# Patient Record
Sex: Male | Born: 1955 | Race: White | Hispanic: No | Marital: Single | Smoking: Never smoker
Health system: Southern US, Community
[De-identification: ages and names within clinical notes are randomized; demographics above are authoritative.]

## PROBLEM LIST (undated history)

## (undated) DIAGNOSIS — K219 Gastro-esophageal reflux disease without esophagitis: Secondary | ICD-10-CM

## (undated) DIAGNOSIS — IMO0002 Reserved for concepts with insufficient information to code with codable children: Secondary | ICD-10-CM

## (undated) DIAGNOSIS — S42402A Unspecified fracture of lower end of left humerus, initial encounter for closed fracture: Secondary | ICD-10-CM

## (undated) DIAGNOSIS — R3911 Hesitancy of micturition: Secondary | ICD-10-CM

## (undated) DIAGNOSIS — D649 Anemia, unspecified: Secondary | ICD-10-CM

## (undated) DIAGNOSIS — R42 Dizziness and giddiness: Secondary | ICD-10-CM

## (undated) DIAGNOSIS — K8020 Calculus of gallbladder without cholecystitis without obstruction: Secondary | ICD-10-CM

## (undated) DIAGNOSIS — Z87448 Personal history of other diseases of urinary system: Secondary | ICD-10-CM

## (undated) DIAGNOSIS — N400 Enlarged prostate without lower urinary tract symptoms: Secondary | ICD-10-CM

## (undated) DIAGNOSIS — K5792 Diverticulitis of intestine, part unspecified, without perforation or abscess without bleeding: Secondary | ICD-10-CM

## (undated) DIAGNOSIS — Z87898 Personal history of other specified conditions: Secondary | ICD-10-CM

## (undated) DIAGNOSIS — R809 Proteinuria, unspecified: Secondary | ICD-10-CM

## (undated) HISTORY — PX: OTHER SURGICAL HISTORY: SHX169

## (undated) HISTORY — PX: WISDOM TOOTH EXTRACTION: SHX21

---

## 2014-11-17 ENCOUNTER — Emergency Department (HOSPITAL_BASED_OUTPATIENT_CLINIC_OR_DEPARTMENT_OTHER): Payer: BLUE CROSS/BLUE SHIELD

## 2014-11-17 ENCOUNTER — Inpatient Hospital Stay (HOSPITAL_BASED_OUTPATIENT_CLINIC_OR_DEPARTMENT_OTHER)
Admission: EM | Admit: 2014-11-17 | Discharge: 2014-11-30 | DRG: 392 | Disposition: A | Discharge status: Home Health | Payer: BLUE CROSS/BLUE SHIELD | Attending: General Surgery | Admitting: General Surgery

## 2014-11-17 ENCOUNTER — Encounter (HOSPITAL_BASED_OUTPATIENT_CLINIC_OR_DEPARTMENT_OTHER): Payer: Self-pay | Admitting: Emergency Medicine

## 2014-11-17 DIAGNOSIS — R350 Frequency of micturition: Secondary | ICD-10-CM | POA: Diagnosis present

## 2014-11-17 DIAGNOSIS — R809 Proteinuria, unspecified: Secondary | ICD-10-CM | POA: Diagnosis present

## 2014-11-17 DIAGNOSIS — Z79899 Other long term (current) drug therapy: Secondary | ICD-10-CM

## 2014-11-17 DIAGNOSIS — R319 Hematuria, unspecified: Secondary | ICD-10-CM | POA: Diagnosis present

## 2014-11-17 DIAGNOSIS — K819 Cholecystitis, unspecified: Secondary | ICD-10-CM

## 2014-11-17 DIAGNOSIS — R109 Unspecified abdominal pain: Secondary | ICD-10-CM | POA: Diagnosis not present

## 2014-11-17 DIAGNOSIS — N400 Enlarged prostate without lower urinary tract symptoms: Secondary | ICD-10-CM | POA: Diagnosis present

## 2014-11-17 DIAGNOSIS — K5720 Diverticulitis of large intestine with perforation and abscess without bleeding: Secondary | ICD-10-CM | POA: Diagnosis not present

## 2014-11-17 DIAGNOSIS — N401 Enlarged prostate with lower urinary tract symptoms: Secondary | ICD-10-CM | POA: Diagnosis present

## 2014-11-17 DIAGNOSIS — K8020 Calculus of gallbladder without cholecystitis without obstruction: Secondary | ICD-10-CM | POA: Diagnosis present

## 2014-11-17 DIAGNOSIS — K219 Gastro-esophageal reflux disease without esophagitis: Secondary | ICD-10-CM | POA: Diagnosis present

## 2014-11-17 DIAGNOSIS — Z1630 Resistance to unspecified antimicrobial drugs: Secondary | ICD-10-CM | POA: Diagnosis present

## 2014-11-17 DIAGNOSIS — Z87891 Personal history of nicotine dependence: Secondary | ICD-10-CM

## 2014-11-17 DIAGNOSIS — K5732 Diverticulitis of large intestine without perforation or abscess without bleeding: Secondary | ICD-10-CM

## 2014-11-17 DIAGNOSIS — K56609 Unspecified intestinal obstruction, unspecified as to partial versus complete obstruction: Secondary | ICD-10-CM

## 2014-11-17 DIAGNOSIS — IMO0002 Reserved for concepts with insufficient information to code with codable children: Secondary | ICD-10-CM | POA: Diagnosis not present

## 2014-11-17 DIAGNOSIS — K5792 Diverticulitis of intestine, part unspecified, without perforation or abscess without bleeding: Secondary | ICD-10-CM | POA: Diagnosis present

## 2014-11-17 DIAGNOSIS — L0291 Cutaneous abscess, unspecified: Secondary | ICD-10-CM

## 2014-11-17 DIAGNOSIS — B9620 Unspecified Escherichia coli [E. coli] as the cause of diseases classified elsewhere: Secondary | ICD-10-CM | POA: Diagnosis present

## 2014-11-17 DIAGNOSIS — R312 Other microscopic hematuria: Secondary | ICD-10-CM | POA: Diagnosis present

## 2014-11-17 HISTORY — DX: Diverticulitis of intestine, part unspecified, without perforation or abscess without bleeding: K57.92

## 2014-11-17 HISTORY — DX: Gastro-esophageal reflux disease without esophagitis: K21.9

## 2014-11-17 LAB — COMPREHENSIVE METABOLIC PANEL
ALK PHOS: 63 U/L (ref 38–126)
ALT: 17 U/L (ref 17–63)
ANION GAP: 7 (ref 5–15)
AST: 25 U/L (ref 15–41)
Albumin: 4 g/dL (ref 3.5–5.0)
BILIRUBIN TOTAL: 1.8 mg/dL — AB (ref 0.3–1.2)
BUN: 12 mg/dL (ref 6–20)
CALCIUM: 8.9 mg/dL (ref 8.9–10.3)
CO2: 27 mmol/L (ref 22–32)
Chloride: 103 mmol/L (ref 101–111)
Creatinine, Ser: 0.97 mg/dL (ref 0.61–1.24)
GFR calc non Af Amer: >60 mL/min (ref >60)
Glucose, Bld: 157 mg/dL — ABNORMAL HIGH (ref 65–99)
POTASSIUM: 4.2 mmol/L (ref 3.5–5.1)
SODIUM: 137 mmol/L (ref 135–145)
TOTAL PROTEIN: 7.6 g/dL (ref 6.5–8.1)

## 2014-11-17 LAB — URINALYSIS, ROUTINE W REFLEX MICROSCOPIC
Glucose, UA: NEGATIVE mg/dL
Ketones, ur: >80 mg/dL — AB
NITRITE: NEGATIVE
PROTEIN: NEGATIVE mg/dL
Specific Gravity, Urine: 1.027 (ref 1.005–1.030)
UROBILINOGEN UA: 1 mg/dL (ref 0.0–1.0)
pH: 5 (ref 5.0–8.0)

## 2014-11-17 LAB — DIFFERENTIAL
BASOS ABS: 0 10*3/uL (ref 0.0–0.1)
Basophils Relative: 0 % (ref 0–1)
EOS PCT: 0 % (ref 0–5)
Eosinophils Absolute: 0 10*3/uL (ref 0.0–0.7)
LYMPHS PCT: 5 % — AB (ref 12–46)
Lymphs Abs: 0.6 10*3/uL — ABNORMAL LOW (ref 0.7–4.0)
Monocytes Absolute: 0.4 10*3/uL (ref 0.1–1.0)
Monocytes Relative: 3 % (ref 3–12)
NEUTROS ABS: 12.3 10*3/uL — AB (ref 1.7–7.7)
NEUTROS PCT: 92 % — AB (ref 43–77)

## 2014-11-17 LAB — CBC
HCT: 44.1 % (ref 39.0–52.0)
HEMOGLOBIN: 15 g/dL (ref 13.0–17.0)
MCH: 33.4 pg (ref 26.0–34.0)
MCHC: 34 g/dL (ref 30.0–36.0)
MCV: 98.2 fL (ref 78.0–100.0)
Platelets: 254 10*3/uL (ref 150–400)
RBC: 4.49 MIL/uL (ref 4.22–5.81)
RDW: 12.2 % (ref 11.5–15.5)
WBC: 13.3 10*3/uL — ABNORMAL HIGH (ref 4.0–10.5)

## 2014-11-17 LAB — URINE MICROSCOPIC-ADD ON

## 2014-11-17 LAB — LIPASE, BLOOD: Lipase: 23 U/L (ref 22–51)

## 2014-11-17 LAB — CK: Total CK: 98 U/L (ref 49–397)

## 2014-11-17 MED ORDER — METRONIDAZOLE IN NACL 5-0.79 MG/ML-% IV SOLN
500.0000 mg | Freq: Once | INTRAVENOUS | Status: AC
  Administered 2014-11-17: 500 mg via INTRAVENOUS
  Filled 2014-11-17: qty 100

## 2014-11-17 MED ORDER — IOHEXOL 300 MG/ML  SOLN
100.0000 mL | Freq: Once | INTRAMUSCULAR | Status: AC | PRN
  Administered 2014-11-17: 100 mL via INTRAVENOUS

## 2014-11-17 MED ORDER — CIPROFLOXACIN IN D5W 400 MG/200ML IV SOLN
400.0000 mg | Freq: Once | INTRAVENOUS | Status: AC
  Administered 2014-11-18: 400 mg via INTRAVENOUS
  Filled 2014-11-17: qty 200

## 2014-11-17 MED ORDER — ONDANSETRON HCL 4 MG/2ML IJ SOLN
4.0000 mg | Freq: Once | INTRAMUSCULAR | Status: AC
  Administered 2014-11-17: 4 mg via INTRAVENOUS
  Filled 2014-11-17: qty 2

## 2014-11-17 MED ORDER — HYDROMORPHONE HCL 1 MG/ML IJ SOLN
1.0000 mg | Freq: Once | INTRAMUSCULAR | Status: AC
  Administered 2014-11-17: 1 mg via INTRAVENOUS
  Filled 2014-11-17: qty 1

## 2014-11-17 MED ORDER — MORPHINE SULFATE (PF) 4 MG/ML IV SOLN
4.0000 mg | Freq: Once | INTRAVENOUS | Status: AC
  Administered 2014-11-17: 4 mg via INTRAVENOUS
  Filled 2014-11-17: qty 1

## 2014-11-17 MED ORDER — IOHEXOL 300 MG/ML  SOLN
50.0000 mL | Freq: Once | INTRAMUSCULAR | Status: AC | PRN
  Administered 2014-11-17: 50 mL via ORAL

## 2014-11-17 MED ORDER — SODIUM CHLORIDE 0.9 % IV BOLUS (SEPSIS)
1000.0000 mL | Freq: Once | INTRAVENOUS | Status: AC
  Administered 2014-11-17: 1000 mL via INTRAVENOUS

## 2014-11-17 NOTE — ED Notes (Signed)
Pa  at bedside. 

## 2014-11-17 NOTE — ED Provider Notes (Signed)
CSN: [id number]     Arrival date & time 11/17/14  1732 History   First MD Initiated Contact with Patient 11/17/14 2019     Chief Complaint  Patient presents with  . Abdominal Pain     (Consider location/radiation/quality/duration/timing/severity/associated sxs/prior Treatment) HPI Comments: 59 year old male with a past medical history diverticulitis and GERD presenting with gradual onset sharp lower abdominal pain more so on the left beginning yesterday evening and continuing throughout the day today. At one point today, he had severe lower abdominal pain that he could not lay flat that passed on its own. He has been on and off the toilet having bowel movements but reports no diarrhea or bloody stool. Admits to associated nausea with one episode of emesis on arrival to the emergency department. The pain currently is not as severe as earlier in the day. Admits to subjective fever and chills. States this feels the same as diverticulitis he's had in the past that resolved with Cipro and Flagyl back in 2001.  Patient is a 59 y.o. male presenting with abdominal pain. The history is provided by the patient.  Abdominal Pain Pain location:  Suprapubic and LLQ Pain radiation: lower abdomen. Pain severity:  Moderate Onset quality:  Gradual Duration:  1 day Timing:  Constant Progression since onset: gradually improving. Chronicity:  New Worsened by:  Position changes Ineffective treatments:  None tried Associated symptoms: nausea and vomiting     Past Medical History  Diagnosis Date  . Diverticulitis   . GERD (gastroesophageal reflux disease)    History reviewed. No pertinent past surgical history. History reviewed. No pertinent family history. Social History  Substance Use Topics  . Smoking status: Former Games developer  . Smokeless tobacco: None  . Alcohol Use: No    Review of Systems  Gastrointestinal: Positive for nausea, vomiting and abdominal pain.  All other systems reviewed and are  negative.     Allergies  Review of patient's allergies indicates no known allergies.  Home Medications   Prior to Admission medications   Medication Sig Start Date End Date Taking? Authorizing Provider  esomeprazole (NEXIUM) 20 MG capsule Take 20 mg by mouth daily at 12 noon.   Yes Historical Provider, MD   BP 108/63 mmHg  Pulse 89  Temp(Src) 98.4 F (36.9 C) (Oral)  Resp 16  Ht 5\' 8"  (1.727 m)  Wt 170 lb (77.111 kg)  BMI 25.85 kg/m2  SpO2 95% Physical Exam  Constitutional: He is oriented to person, place, and time. He appears well-developed and well-nourished. No distress.  HENT:  Head: Normocephalic and atraumatic.  Eyes: Conjunctivae and EOM are normal.  Neck: Normal range of motion. Neck supple.  Cardiovascular: Normal rate, regular rhythm and normal heart sounds.   Pulmonary/Chest: Effort normal and breath sounds normal.  Abdominal: Soft. Normal appearance and bowel sounds are normal. He exhibits no distension. There is tenderness in the right lower quadrant, suprapubic area and left lower quadrant. There is guarding. There is no rigidity and no rebound.  Pain more so left sided.  Musculoskeletal: Normal range of motion. He exhibits no edema.  Neurological: He is alert and oriented to person, place, and time.  Skin: Skin is warm and dry.  Psychiatric: He has a normal mood and affect. His behavior is normal.  Nursing note and vitals reviewed.   ED Course  Procedures (including critical care time) Labs Review Labs Reviewed  COMPREHENSIVE METABOLIC PANEL - Abnormal; Notable for the following:    Glucose, Bld 157 (*)  Total Bilirubin 1.8 (*)    All other components within normal limits  CBC - Abnormal; Notable for the following:    WBC 13.3 (*)    All other components within normal limits  URINALYSIS, ROUTINE W REFLEX MICROSCOPIC (NOT AT [facility]) - Abnormal; Notable for the following:    Color, Urine AMBER (*)    APPearance CLOUDY (*)    Hgb urine dipstick SMALL  (*)    Bilirubin Urine SMALL (*)    Ketones, ur >80 (*)    Leukocytes, UA SMALL (*)    All other components within normal limits  DIFFERENTIAL - Abnormal; Notable for the following:    Neutrophils Relative % 92 (*)    Neutro Abs 12.3 (*)    Lymphocytes Relative 5 (*)    Lymphs Abs 0.6 (*)    All other components within normal limits  URINE MICROSCOPIC-ADD ON - Abnormal; Notable for the following:    Squamous Epithelial / LPF FEW (*)    All other components within normal limits  LIPASE, BLOOD    Imaging Review No results found. I have personally reviewed and evaluated these images and lab results as part of my medical decision-making.   EKG Interpretation None      MDM   Final diagnoses:  None   Nontoxic appearing, NAD. AF VSS. Has lower abdominal pain with guarding, more so on the left. Had a severe pain that prevented him from straightening of his body earlier in the day. Possibly diverticulitis, however cannot rule out abscess or perforation. Initially the patient requested to be discharged home without getting a CT scan and just starting on antibiotics, however after further discussion with patient by Dr. Manus Gunning, he is agreeable to getting a CT scan to rule out abscess or perforation. Has leukocytosis with left shift. Pt signed out to Dr. Manus Gunning at shift change with CT pending.  Kathrynn Speed, PA-C 11/17/14 2217  Glynn Octave, MD 11/18/14 1610  Glynn Octave, MD 11/18/14 [phone]

## 2014-11-17 NOTE — ED Notes (Signed)
Patient transported to Ultrasound 

## 2014-11-17 NOTE — ED Notes (Signed)
Pt reports abdominal pain that is increasing throughout the day

## 2014-11-18 ENCOUNTER — Inpatient Hospital Stay (HOSPITAL_COMMUNITY): Payer: BLUE CROSS/BLUE SHIELD

## 2014-11-18 DIAGNOSIS — Z79899 Other long term (current) drug therapy: Secondary | ICD-10-CM | POA: Diagnosis not present

## 2014-11-18 DIAGNOSIS — R109 Unspecified abdominal pain: Secondary | ICD-10-CM | POA: Diagnosis present

## 2014-11-18 DIAGNOSIS — K8020 Calculus of gallbladder without cholecystitis without obstruction: Secondary | ICD-10-CM | POA: Diagnosis present

## 2014-11-18 DIAGNOSIS — R312 Other microscopic hematuria: Secondary | ICD-10-CM | POA: Diagnosis present

## 2014-11-18 DIAGNOSIS — K219 Gastro-esophageal reflux disease without esophagitis: Secondary | ICD-10-CM | POA: Diagnosis present

## 2014-11-18 DIAGNOSIS — K5720 Diverticulitis of large intestine with perforation and abscess without bleeding: Secondary | ICD-10-CM | POA: Diagnosis present

## 2014-11-18 DIAGNOSIS — Z87891 Personal history of nicotine dependence: Secondary | ICD-10-CM | POA: Diagnosis not present

## 2014-11-18 DIAGNOSIS — N401 Enlarged prostate with lower urinary tract symptoms: Secondary | ICD-10-CM | POA: Diagnosis present

## 2014-11-18 DIAGNOSIS — R809 Proteinuria, unspecified: Secondary | ICD-10-CM | POA: Diagnosis present

## 2014-11-18 DIAGNOSIS — Z1630 Resistance to unspecified antimicrobial drugs: Secondary | ICD-10-CM | POA: Diagnosis present

## 2014-11-18 DIAGNOSIS — R350 Frequency of micturition: Secondary | ICD-10-CM | POA: Diagnosis present

## 2014-11-18 DIAGNOSIS — K5792 Diverticulitis of intestine, part unspecified, without perforation or abscess without bleeding: Secondary | ICD-10-CM | POA: Diagnosis present

## 2014-11-18 DIAGNOSIS — B9620 Unspecified Escherichia coli [E. coli] as the cause of diseases classified elsewhere: Secondary | ICD-10-CM | POA: Diagnosis present

## 2014-11-18 LAB — URINALYSIS, ROUTINE W REFLEX MICROSCOPIC
Glucose, UA: 100 mg/dL — AB
Ketones, ur: 40 mg/dL — AB
Nitrite: NEGATIVE
Protein, ur: 30 mg/dL — AB
Specific Gravity, Urine: 1.026 (ref 1.005–1.030)
Urobilinogen, UA: 1 mg/dL (ref 0.0–1.0)
pH: 5.5 (ref 5.0–8.0)

## 2014-11-18 LAB — CBC
HCT: 37.1 % — ABNORMAL LOW (ref 39.0–52.0)
Hemoglobin: 12.7 g/dL — ABNORMAL LOW (ref 13.0–17.0)
MCH: 33.8 pg (ref 26.0–34.0)
MCHC: 34.2 g/dL (ref 30.0–36.0)
MCV: 98.7 fL (ref 78.0–100.0)
Platelets: 216 K/uL (ref 150–400)
RBC: 3.76 MIL/uL — ABNORMAL LOW (ref 4.22–5.81)
RDW: 13 % (ref 11.5–15.5)
WBC: 12.9 K/uL — ABNORMAL HIGH (ref 4.0–10.5)

## 2014-11-18 LAB — URINE MICROSCOPIC-ADD ON

## 2014-11-18 MED ORDER — ONDANSETRON 4 MG PO TBDP
4.0000 mg | ORAL_TABLET | Freq: Four times a day (QID) | ORAL | Status: DC | PRN
  Administered 2014-11-19 – 2014-11-29 (×6): 4 mg via ORAL
  Filled 2014-11-18 (×6): qty 1

## 2014-11-18 MED ORDER — KETOROLAC TROMETHAMINE 30 MG/ML IJ SOLN
30.0000 mg | Freq: Once | INTRAMUSCULAR | Status: AC
  Administered 2014-11-18: 30 mg via INTRAVENOUS
  Filled 2014-11-18: qty 1

## 2014-11-18 MED ORDER — METRONIDAZOLE IN NACL 5-0.79 MG/ML-% IV SOLN
500.0000 mg | Freq: Three times a day (TID) | INTRAVENOUS | Status: DC
  Administered 2014-11-18 – 2014-11-19 (×6): 500 mg via INTRAVENOUS
  Filled 2014-11-18 (×8): qty 100

## 2014-11-18 MED ORDER — TECHNETIUM TC 99M MEBROFENIN IV KIT
5.1000 | PACK | Freq: Once | INTRAVENOUS | Status: DC | PRN
  Administered 2014-11-18: 5.1 via INTRAVENOUS
  Filled 2014-11-18: qty 6

## 2014-11-18 MED ORDER — POTASSIUM CHLORIDE IN NACL 20-0.9 MEQ/L-% IV SOLN
INTRAVENOUS | Status: DC
  Administered 2014-11-18: 03:00:00 via INTRAVENOUS
  Administered 2014-11-18: 125 mL/h via INTRAVENOUS
  Administered 2014-11-18 – 2014-11-21 (×5): via INTRAVENOUS
  Administered 2014-11-21: 125 mL/h via INTRAVENOUS
  Filled 2014-11-18 (×17): qty 1000

## 2014-11-18 MED ORDER — HYDROMORPHONE HCL 1 MG/ML IJ SOLN
1.0000 mg | Freq: Once | INTRAMUSCULAR | Status: AC
  Administered 2014-11-18: 1 mg via INTRAVENOUS
  Filled 2014-11-18: qty 1

## 2014-11-18 MED ORDER — ONDANSETRON HCL 4 MG/2ML IJ SOLN
4.0000 mg | Freq: Four times a day (QID) | INTRAMUSCULAR | Status: DC | PRN
  Administered 2014-11-18 – 2014-11-21 (×3): 4 mg via INTRAVENOUS
  Filled 2014-11-18 (×4): qty 2

## 2014-11-18 MED ORDER — HYDROMORPHONE HCL 1 MG/ML IJ SOLN
1.0000 mg | INTRAMUSCULAR | Status: DC | PRN
  Administered 2014-11-18 – 2014-11-19 (×10): 1 mg via INTRAVENOUS
  Filled 2014-11-18 (×11): qty 1

## 2014-11-18 MED ORDER — ACETAMINOPHEN 650 MG RE SUPP: 650.0000 mg | Freq: Four times a day (QID) | RECTAL | Status: DC | PRN

## 2014-11-18 MED ORDER — ACETAMINOPHEN 325 MG PO TABS
650.0000 mg | ORAL_TABLET | Freq: Four times a day (QID) | ORAL | Status: DC | PRN
  Administered 2014-11-18 – 2014-11-20 (×2): 650 mg via ORAL
  Filled 2014-11-18 (×2): qty 2

## 2014-11-18 MED ORDER — ENOXAPARIN SODIUM 40 MG/0.4ML ~~LOC~~ SOLN
40.0000 mg | Freq: Every day | SUBCUTANEOUS | Status: DC
  Administered 2014-11-18 – 2014-11-20 (×4): 40 mg via SUBCUTANEOUS
  Filled 2014-11-18 (×8): qty 0.4

## 2014-11-18 MED ORDER — MORPHINE SULFATE (PF) 2 MG/ML IV SOLN: 1.0000 mg | INTRAVENOUS | Status: DC | PRN

## 2014-11-18 MED ORDER — DEXTROSE 5 % IV SOLN
2.0000 g | Freq: Every day | INTRAVENOUS | Status: DC
  Administered 2014-11-18 – 2014-11-19 (×2): 2 g via INTRAVENOUS
  Filled 2014-11-18 (×3): qty 2

## 2014-11-18 NOTE — Progress Notes (Signed)
Gen. surgery attending:  Hepatobiliary scan shows normal uptake of tracer and normal excretion.  Gallbladder fills in 20 minutes.  No evidence of cystic duct obstruction.  We will hold off on cholecystectomy at this point in time and treated for diverticulitis with bowel rest and antibiotics.  Angelia Mould. Derrell Lolling, M.D., [facility] General and Minimally invasive Surgery Breast and Colorectal Surgery Office:   [phone]

## 2014-11-18 NOTE — ED Provider Notes (Signed)
Dr. Magnus Ivan is aware of the patient's presence in the emergency department. Will place orders. Patient is well-appearing. Normal vital signs. States pain is well controlled.  Loren Racer, MD 11/18/14 Earle Gell

## 2014-11-18 NOTE — H&P (Signed)
[name] is an 59 y.o. male.   Chief Complaint: Abdominal pain HPI: This general and presented to [facility] with lower abdominal pain last evening. After arrival there he developed nausea and vomiting. When seen in the emergency department he apparently had tenderness in the right upper quadrant as well as left lower quadrant. He reports the pain was mostly in the left lower quadrant and then moved to the right upper quadrant. A CT scan showed inflammatory changes around the sigmoid colon and suggested this also around the gallbladder and the terminal ileum. This morning, he reports that most of his discomfort is in the right upper quadrant. He is otherwise healthy without complaints. He had a bout of diverticulitis approximate 16 years ago which was treated with antibody. He had no CAT scan at that time. He has never had a colonoscopy. Bowel movements have been normal. The pain is described as sharp and constant  Past Medical History  Diagnosis Date  . Diverticulitis   . GERD (gastroesophageal reflux disease)     History reviewed. No pertinent past surgical history.  History reviewed. No pertinent family history. Social History:  reports that he has quit smoking. He does not have any smokeless tobacco history on file. He reports that he does not drink alcohol. His drug history is not on file.  Allergies:  Allergies  Allergen Reactions  . Food     LIMA BEANS/COCONUT-NAUSEA/VOMITING    Medications Prior to Admission  Medication Sig Dispense Refill  . ibuprofen (ADVIL,MOTRIN) 200 MG tablet Take 600 mg by mouth every 6 (six) hours as needed (for pain).    . ranitidine (ZANTAC) 150 MG tablet Take 150 mg by mouth 2 (two) times daily.      Results for orders placed or performed during the hospital encounter of 11/17/14 (from the past 48 hour(s))  Lipase, blood     Status: None   Collection Time: 11/17/14  6:25 PM  Result Value Ref Range   Lipase 23 22 - 51 U/L   Comprehensive metabolic panel     Status: Abnormal   Collection Time: 11/17/14  6:25 PM  Result Value Ref Range   Sodium 137 135 - 145 mmol/L   Potassium 4.2 3.5 - 5.1 mmol/L   Chloride 103 101 - 111 mmol/L   CO2 27 22 - 32 mmol/L   Glucose, Bld 157 (H) 65 - 99 mg/dL   BUN 12 6 - 20 mg/dL   Creatinine, Ser 5.42 0.61 - 1.24 mg/dL   Calcium 8.9 8.9 - 82.2 mg/dL   Total Protein 7.6 6.5 - 8.1 g/dL   Albumin 4.0 3.5 - 5.0 g/dL   AST 25 15 - 41 U/L   ALT 17 17 - 63 U/L   Alkaline Phosphatase 63 38 - 126 U/L   Total Bilirubin 1.8 (H) 0.3 - 1.2 mg/dL   GFR calc non Af Amer >60 >60 mL/min   GFR calc Af Amer >60 >60 mL/min    Comment: (NOTE) The eGFR has been calculated using the CKD EPI equation. This calculation has not been validated in all clinical situations. eGFR's persistently <60 mL/min signify possible Chronic Kidney Disease.    Anion gap 7 5 - 15  CBC     Status: Abnormal   Collection Time: 11/17/14  6:25 PM  Result Value Ref Range   WBC 13.3 (H) 4.0 - 10.5 K/uL   RBC 4.49 4.22 - 5.81 MIL/uL   Hemoglobin 15.0 13.0 - 17.0 g/dL  HCT 44.1 39.0 - 52.0 %   MCV 98.2 78.0 - 100.0 fL   MCH 33.4 26.0 - 34.0 pg   MCHC 34.0 30.0 - 36.0 g/dL   RDW 12.2 11.5 - 15.5 %   Platelets 254 150 - 400 K/uL  Urinalysis, Routine w reflex microscopic (not at [facility])     Status: Abnormal   Collection Time: 11/17/14  6:25 PM  Result Value Ref Range   Color, Urine AMBER (A) YELLOW    Comment: BIOCHEMICALS MAY BE AFFECTED BY COLOR   APPearance CLOUDY (A) CLEAR   Specific Gravity, Urine 1.027 1.005 - 1.030   pH 5.0 5.0 - 8.0   Glucose, UA NEGATIVE NEGATIVE mg/dL   Hgb urine dipstick SMALL (A) NEGATIVE   Bilirubin Urine SMALL (A) NEGATIVE   Ketones, ur >80 (A) NEGATIVE mg/dL   Protein, ur NEGATIVE NEGATIVE mg/dL   Urobilinogen, UA 1.0 0.0 - 1.0 mg/dL   Nitrite NEGATIVE NEGATIVE   Leukocytes, UA SMALL (A) NEGATIVE  Differential     Status: Abnormal   Collection Time: 11/17/14  6:25 PM   Result Value Ref Range   Neutrophils Relative % 92 (H) 43 - 77 %   Neutro Abs 12.3 (H) 1.7 - 7.7 K/uL   Lymphocytes Relative 5 (L) 12 - 46 %   Lymphs Abs 0.6 (L) 0.7 - 4.0 K/uL   Monocytes Relative 3 3 - 12 %   Monocytes Absolute 0.4 0.1 - 1.0 K/uL   Eosinophils Relative 0 0 - 5 %   Eosinophils Absolute 0.0 0.0 - 0.7 K/uL   Basophils Relative 0 0 - 1 %   Basophils Absolute 0.0 0.0 - 0.1 K/uL  Urine microscopic-add on     Status: Abnormal   Collection Time: 11/17/14  6:25 PM  Result Value Ref Range   Squamous Epithelial / LPF FEW (A) RARE   WBC, UA 3-6 <3 WBC/hpf   RBC / HPF 3-6 <3 RBC/hpf   Bacteria, UA RARE RARE   Urine-Other MUCOUS PRESENT   CK     Status: None   Collection Time: 11/17/14  6:25 PM  Result Value Ref Range   Total CK 98 49 - 397 U/L   Ct Abdomen Pelvis W Contrast  11/17/2014   CLINICAL DATA:  Sharp lower abdominal pain mostly to the left beginning yesterday and continuing today. Nausea with vomiting. Fever and chills.  EXAM: CT ABDOMEN AND PELVIS WITH CONTRAST  TECHNIQUE: Multidetector CT imaging of the abdomen and pelvis was performed using the standard protocol following bolus administration of intravenous contrast.  CONTRAST:  145mL OMNIPAQUE IOHEXOL 300 MG/ML SOLN, 37mL OMNIPAQUE IOHEXOL 300 MG/ML SOLN  COMPARISON:  None.  FINDINGS: Dependent type atelectasis in the lung bases. Residual contrast material in the esophagus may indicate reflux or dysmotility.  Cholelithiasis with several stones in the gallbladder. Suggestion of mild pericholecystic edema. Changes may indicate cholecystitis. No bile duct dilatation. The liver, spleen, pancreas, adrenal glands, kidneys, abdominal aorta, inferior vena cava, and retroperitoneal lymph nodes are unremarkable. Stomach, small bowel, and colon are not abnormally distended. Contrast material flows through to the colon without evidence of bowel obstruction. Suggestion of mild wall thickening in the terminal ileum could represent  reactive inflammation or enteritis. No free air or free fluid in the abdomen.  Pelvis: The appendix is normal. Diverticulosis of sigmoid colon. Inflammatory infiltration in the fat around the sigmoid colon consistent with sigmoid diverticulitis. No abscess. Bladder is decompressed. Prostate gland is enlarged with calcifications. Small amount  of free fluid in the pelvis is likely reactive. No destructive bone lesions.  IMPRESSION: 1. Cholelithiasis with mild pericholecystic edema. Changes are nonspecific but could indicate cholecystitis. 2. Sigmoid diverticulosis with diverticulitis.  No abscess. 3. Thickening of the wall of the terminal ileum may indicate reactive inflammation or enteritis. No obstruction.   Electronically Signed   By: Lucienne Capers M.D.   On: 11/17/2014 23:30   US Abdomen Limited Ruq  11/18/2014   CLINICAL DATA:  Left lower quadrant pain and right lower quadrant pain for 12 hours.  EXAM: US ABDOMEN LIMITED - RIGHT UPPER QUADRANT  COMPARISON:  CT abdomen and pelvis 11/17/2014  FINDINGS: Gallbladder:  Cholelithiasis with multiple stones in the gallbladder, largest measuring about 1.5 cm diameter. Diffuse gallbladder wall thickening up to 4 mm with mild edema. Murphy's sign is negative. However, patient has had pain medications, limiting the sensitivity of Murphy's sign.  Common bile duct:  Diameter: 5 mm, normal  Liver:  No focal lesion identified. Within normal limits in parenchymal echogenicity.  IMPRESSION: Cholelithiasis with edematous gallbladder wall thickening, likely indicating acute cholecystitis.   Electronically Signed   By: Lucienne Capers M.D.   On: 11/18/2014 00:34    Review of Systems  All other systems reviewed and are negative.   Blood pressure 88/46, pulse 70, temperature 98 F (36.7 C), temperature source Oral, resp. rate 18, height $RemoveBe'5\' 8"'ClIEfeswb$  (1.727 m), weight 77.111 kg (170 lb), SpO2 100 %. Physical Exam  Constitutional: He is oriented to person, place, and time. He  appears well-developed and well-nourished. No distress.  HENT:  Head: Normocephalic and atraumatic.  Right Ear: External ear normal.  Left Ear: External ear normal.  Mouth/Throat: No oropharyngeal exudate.  Eyes: Conjunctivae are normal. Pupils are equal, round, and reactive to light. No scleral icterus.  Neck: Normal range of motion. No tracheal deviation present.  Cardiovascular: Normal rate, regular rhythm, normal heart sounds and intact distal pulses.   No murmur heard. Respiratory: Effort normal and breath sounds normal. No respiratory distress.  GI: Soft. There is tenderness. There is guarding.  There is marked tenderness with guarding in the right upper quadrant with much less tenderness in the left lower quadrant  Musculoskeletal: Normal range of motion. He exhibits no edema.  Neurological: He is alert and oriented to person, place, and time.  Skin: Skin is warm and dry. No erythema.  Psychiatric: His behavior is normal.     Assessment/Plan Acute cholecystitis with cholelithiasis  His clinical exam is most consistent with acute cholecystitis. There are changes on the CAT scan of mild diverticulitis and some inflammation around the terminal ileum but this may all be secondary to the cholecystitis. IV antibodies have been started.  I will discuss whether or not he needs laparoscopy versus a HIDA scan this morning with Dr. Dalbert Batman who is our main surgeon over here today.  Jai Bear A 11/18/2014, 6:27 AM

## 2014-11-18 NOTE — Progress Notes (Signed)
Gen. surgery attending:  I have discussed the patient's presentation and findings with Dr. Magnus Ivan.  Please see his note from earlier this morning The patient states that his pain is in the lower abdomen.  He states that he was treated for diverticulitis empirically in the past. 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  He wants to do everything possible to avoid missing that activity for financial reasons.  On exam he is awake and alert.  His abdomen is soft.  At this time he is more tender in the left lower quadrant and right lower quadrant.  He is less tender in the right upper quadrant.  No tenderness to percuss the right or left costal margin.  Assessment/plan:  He may have diverticulitis and acute cholecystitis, simultaneously.  This would be unusual but not impossible. Physical exam, at least at this hour, is not dramatically focal, but seems to be more lower abdomen.  Continue bowel rest and IV antibiotics Proceed with hepatobiliary scan stat to help differentiate whether he has cholecystitis or not. I told him if the gallbladder did not fill that we would have to assume this was cholecystitis and cholecystectomy would be recommended. If the hepatobiliary scan is normal, we will treat him with antibiotics for diverticulitis.   Angelia Mould. Derrell Lolling, M.D., [facility]

## 2014-11-18 NOTE — ED Notes (Signed)
Bed: WA07 Expected date:  Expected time:  Means of arrival:  Comments: Transfer [facility]  Cholecystitis

## 2014-11-18 NOTE — Progress Notes (Signed)
Dr Derrell Lolling called to request non narcotic pain med. Dr Derrell Lolling notified patients urine dark this am. Stated to send urine if not already done this admission.-has been done. Pain med order noted

## 2014-11-18 NOTE — ED Notes (Signed)
Pt received from [facility]. Pt is a consult for general surgery.

## 2014-11-19 LAB — CBC
HCT: 38.9 % — ABNORMAL LOW (ref 39.0–52.0)
Hemoglobin: 13.1 g/dL (ref 13.0–17.0)
MCH: 33.9 pg (ref 26.0–34.0)
MCHC: 33.7 g/dL (ref 30.0–36.0)
MCV: 100.5 fL — ABNORMAL HIGH (ref 78.0–100.0)
PLATELETS: 220 10*3/uL (ref 150–400)
RBC: 3.87 MIL/uL — ABNORMAL LOW (ref 4.22–5.81)
RDW: 13.5 % (ref 11.5–15.5)
WBC: 13.3 10*3/uL — ABNORMAL HIGH (ref 4.0–10.5)

## 2014-11-19 LAB — URINALYSIS, ROUTINE W REFLEX MICROSCOPIC
Glucose, UA: 100 mg/dL — AB
Ketones, ur: 40 mg/dL — AB
NITRITE: NEGATIVE
Protein, ur: 30 mg/dL — AB
SPECIFIC GRAVITY, URINE: 1.031 — AB (ref 1.005–1.030)
UROBILINOGEN UA: 2 mg/dL — AB (ref 0.0–1.0)
pH: 5.5 (ref 5.0–8.0)

## 2014-11-19 LAB — COMPREHENSIVE METABOLIC PANEL
ALT: 17 U/L (ref 17–63)
ANION GAP: 8 (ref 5–15)
AST: 20 U/L (ref 15–41)
Albumin: 3.2 g/dL — ABNORMAL LOW (ref 3.5–5.0)
Alkaline Phosphatase: 62 U/L (ref 38–126)
BUN: 14 mg/dL (ref 6–20)
CHLORIDE: 106 mmol/L (ref 101–111)
CO2: 22 mmol/L (ref 22–32)
Calcium: 8.2 mg/dL — ABNORMAL LOW (ref 8.9–10.3)
Creatinine, Ser: 0.81 mg/dL (ref 0.61–1.24)
GFR calc non Af Amer: >60 mL/min (ref >60)
Glucose, Bld: 121 mg/dL — ABNORMAL HIGH (ref 65–99)
POTASSIUM: 4.5 mmol/L (ref 3.5–5.1)
SODIUM: 136 mmol/L (ref 135–145)
Total Bilirubin: 1.4 mg/dL — ABNORMAL HIGH (ref 0.3–1.2)
Total Protein: 6.7 g/dL (ref 6.5–8.1)

## 2014-11-19 LAB — URINE MICROSCOPIC-ADD ON

## 2014-11-19 MED ORDER — HYDROCODONE-ACETAMINOPHEN 5-325 MG PO TABS
1.0000 | ORAL_TABLET | ORAL | Status: DC | PRN
Start: 2014-11-19 — End: 2014-11-30
  Administered 2014-11-19: 2 via ORAL
  Administered 2014-11-19 (×3): 1 via ORAL
  Administered 2014-11-19 – 2014-11-23 (×18): 2 via ORAL
  Administered 2014-11-24: 1 via ORAL
  Administered 2014-11-24 (×3): 2 via ORAL
  Administered 2014-11-24: 1 via ORAL
  Administered 2014-11-24 – 2014-11-25 (×3): 2 via ORAL
  Administered 2014-11-25: 1 via ORAL
  Administered 2014-11-25 – 2014-11-26 (×4): 2 via ORAL
  Administered 2014-11-26: 1 via ORAL
  Administered 2014-11-26 – 2014-11-29 (×16): 2 via ORAL
  Administered 2014-11-29: 1 via ORAL
  Administered 2014-11-29 – 2014-11-30 (×3): 2 via ORAL
  Filled 2014-11-19 (×15): qty 2
  Filled 2014-11-19: qty 1
  Filled 2014-11-19: qty 2
  Filled 2014-11-19: qty 1
  Filled 2014-11-19: qty 2
  Filled 2014-11-19: qty 1
  Filled 2014-11-19 (×7): qty 2
  Filled 2014-11-19: qty 1
  Filled 2014-11-19 (×3): qty 2
  Filled 2014-11-19 (×2): qty 1
  Filled 2014-11-19 (×13): qty 2
  Filled 2014-11-19: qty 1
  Filled 2014-11-19 (×4): qty 2
  Filled 2014-11-19: qty 1
  Filled 2014-11-19 (×3): qty 2
  Filled 2014-11-19 (×2): qty 1
  Filled 2014-11-19 (×2): qty 2

## 2014-11-19 MED ORDER — MORPHINE SULFATE (PF) 2 MG/ML IV SOLN: 1.0000 mg | INTRAVENOUS | Status: DC | PRN

## 2014-11-19 NOTE — Progress Notes (Signed)
[facility] Progress Note     Subjective: Pt still in a lot of pain in LLQ.  Some nausea with IV pain meds.  Not amblated much but plans on it.  No vomiting.  Feels distended.  Urinating okay, but urine is bloody/dark.    Objective: Vital signs in last 24 hours: Temp:  [98.1 F (36.7 C)-100.2 F (37.9 C)] 98.5 F (36.9 C) (09/08 0542) Pulse Rate:  [70-97] 96 (09/08 0542) Resp:  [16-18] 16 (09/08 0542) BP: (98-145)/(66-83) 117/79 mmHg (09/08 0542) SpO2:  [95 %-98 %] 96 % (09/08 0542) Last BM Date: 11/17/14  Intake/Output from previous day: 09/07 0701 - 09/08 0700 In: 3100 [I.V.:3000; IV Piggyback:100] Out: 100 [Urine:100] Intake/Output this shift:    PE: Gen:  Alert, NAD, pleasant Abd: Soft, distended, tender in LLQ and suprapubic region, +BS, no HSM   Lab Results:   Recent Labs  11/18/14 0650 11/19/14 0710  WBC 12.9* 13.3*  HGB 12.7* 13.1  HCT 37.1* 38.9*  PLT 216 220   BMET  Recent Labs  11/17/14 1825  NA 137  K 4.2  CL 103  CO2 27  GLUCOSE 157*  BUN 12  CREATININE 0.97  CALCIUM 8.9   PT/INR No results for input(s): LABPROT, INR in the last 72 hours. CMP     Component Value Date/Time   NA 137 11/17/2014 1825   K 4.2 11/17/2014 1825   CL 103 11/17/2014 1825   CO2 27 11/17/2014 1825   GLUCOSE 157* 11/17/2014 1825   BUN 12 11/17/2014 1825   CREATININE 0.97 11/17/2014 1825   CALCIUM 8.9 11/17/2014 1825   PROT 7.6 11/17/2014 1825   ALBUMIN 4.0 11/17/2014 1825   AST 25 11/17/2014 1825   ALT 17 11/17/2014 1825   ALKPHOS 63 11/17/2014 1825   BILITOT 1.8* 11/17/2014 1825   GFRNONAA >60 11/17/2014 1825   GFRAA >60 11/17/2014 1825   Lipase     Component Value Date/Time   LIPASE 23 11/17/2014 1825       Studies/Results: Nm Hepatobiliary Including Gb  11/18/2014   CLINICAL DATA:  Cholecystitis  EXAM: NUCLEAR MEDICINE HEPATOBILIARY IMAGING  TECHNIQUE: Sequential images of the abdomen were obtained out to 60 minutes following  intravenous administration of radiopharmaceutical.  RADIOPHARMACEUTICALS:  5.1 mCi Tc-72m  Choletec IV  COMPARISON:  Ultrasound 11/18/2014  FINDINGS: There is normal uptake of the tracer by the liver. CBD visualized at 10 minutes. Gallbladder visualized at 20 minutes. Bowel activity noted at 15 minutes.  IMPRESSION: No cystic duct obstruction.  Gallbladder visualized at 20 minutes.   Electronically Signed   By: Natasha Mead M.D.   On: 11/18/2014 13:50   Ct Abdomen Pelvis W Contrast  11/17/2014   CLINICAL DATA:  Lambert Mody lower abdominal pain mostly to the left beginning yesterday and continuing today. Nausea with vomiting. Fever and chills.  EXAM: CT ABDOMEN AND PELVIS WITH CONTRAST  TECHNIQUE: Multidetector CT imaging of the abdomen and pelvis was performed using the standard protocol following bolus administration of intravenous contrast.  CONTRAST:  OMNIPAQUE IOHEXOL 300 MG/ML SOLN, 50mL OMNIPAQUE IOHEXOL 300 MG/ML SOLN  COMPARISON:  None.  FINDINGS: Dependent type atelectasis in the lung bases. Residual contrast material in the esophagus may indicate reflux or dysmotility.  Cholelithiasis with several stones in the gallbladder. Suggestion of mild pericholecystic edema. Changes may indicate cholecystitis. No bile duct dilatation. The liver, spleen, pancreas, adrenal glands, kidneys, abdominal aorta, inferior vena cava, and retroperitoneal lymph nodes are unremarkable. Stomach, small bowel,  and colon are not abnormally distended. Contrast material flows through to the colon without evidence of bowel obstruction. Suggestion of mild wall thickening in the terminal ileum could represent reactive inflammation or enteritis. No free air or free fluid in the abdomen.  Pelvis: The appendix is normal. Diverticulosis of sigmoid colon. Inflammatory infiltration in the fat around the sigmoid colon consistent with sigmoid diverticulitis. No abscess. Bladder is decompressed. Prostate gland is enlarged with calcifications.  Small amount of free fluid in the pelvis is likely reactive. No destructive bone lesions.  IMPRESSION: 1. Cholelithiasis with mild pericholecystic edema. Changes are nonspecific but could indicate cholecystitis. 2. Sigmoid diverticulosis with diverticulitis.  No abscess. 3. Thickening of the wall of the terminal ileum may indicate reactive inflammation or enteritis. No obstruction.   Electronically Signed   By: Burman Nieves M.D.   On: 11/17/2014 23:30   US Abdomen Limited Ruq  11/18/2014   CLINICAL DATA:  Left lower quadrant pain and right lower quadrant pain for 12 hours.  EXAM: US ABDOMEN LIMITED - RIGHT UPPER QUADRANT  COMPARISON:  CT abdomen and pelvis 11/17/2014  FINDINGS: Gallbladder:  Cholelithiasis with multiple stones in the gallbladder, largest measuring about 1.5 cm diameter. Diffuse gallbladder wall thickening up to 4 mm with mild edema. Murphy's sign is negative. However, patient has had pain medications, limiting the sensitivity of Murphy's sign.  Common bile duct:  Diameter: 5 mm, normal  Liver:  No focal lesion identified. Within normal limits in parenchymal echogenicity.  IMPRESSION: Cholelithiasis with edematous gallbladder wall thickening, likely indicating acute cholecystitis.   Electronically Signed   By: Burman Nieves M.D.   On: 11/18/2014 00:34    Anti-infectives: Anti-infectives    Start     Dose/Rate Route Frequency Ordered Stop   11/18/14 2200  cefTRIAXone (ROCEPHIN) 2 g in dextrose 5 % 50 mL IVPB     2 g 100 mL/hr over 30 Minutes Intravenous Daily at bedtime 11/18/14 0254     11/18/14 0800  metroNIDAZOLE (FLAGYL) IVPB 500 mg     500 mg 100 mL/hr over 60 Minutes Intravenous Every 8 hours 11/18/14 0254     11/17/14 2345  ciprofloxacin (CIPRO) IVPB 400 mg     400 mg 200 mL/hr over 60 Minutes Intravenous  Once 11/17/14 2334 11/18/14 0212   11/17/14 2345  metroNIDAZOLE (FLAGYL) IVPB 500 mg     500 mg 100 mL/hr over 60 Minutes Intravenous  Once 11/17/14 2334 11/18/14  0044       Assessment/Plan Cholelithiasis -HIDA negative, no plans for cholecystectomy at this point -Follow up with Dr. Derrell Lolling as an outpatient to discuss lap chole at later date if desired   Diverticulitis -IVF, pain control (wean to orals) -IV antibiotics (Rocephin/flagyl Day #2) -Hold on diet for since still has significant pain -Ambulate and IS -SCD's and lovenox  Hematuria/Proteinuria -Called urology, Dr. Ronne Binning to see, has had h/o BPH in past    LOS: 1 day    Nonie Hoyer 11/19/2014, 7:46 AM Pager: [phone]

## 2014-11-20 LAB — CBC
HEMATOCRIT: 37.1 % — AB (ref 39.0–52.0)
Hemoglobin: 12.4 g/dL — ABNORMAL LOW (ref 13.0–17.0)
MCH: 33.7 pg (ref 26.0–34.0)
MCHC: 33.4 g/dL (ref 30.0–36.0)
MCV: 100.8 fL — ABNORMAL HIGH (ref 78.0–100.0)
PLATELETS: 226 10*3/uL (ref 150–400)
RBC: 3.68 MIL/uL — ABNORMAL LOW (ref 4.22–5.81)
RDW: 13.5 % (ref 11.5–15.5)
WBC: 10.6 10*3/uL — AB (ref 4.0–10.5)

## 2014-11-20 LAB — URINE CULTURE: Culture: NO GROWTH

## 2014-11-20 MED ORDER — SODIUM CHLORIDE 0.9 % IV SOLN
1.0000 g | INTRAVENOUS | Status: DC
  Administered 2014-11-20 – 2014-11-22 (×3): 1 g via INTRAVENOUS
  Filled 2014-11-20 (×3): qty 1

## 2014-11-20 NOTE — Progress Notes (Signed)
[facility] Progress Note     Subjective: Pt frustrated because he still feels pain.  No N/V, having better flatus over last 24 hours and less distended.  No BM yet.  Ambulating well - did 5 laps yesterday.    Objective: Vital signs in last 24 hours: Temp:  [98.2 F (36.8 C)-98.9 F (37.2 C)] 98.9 F (37.2 C) (09/09 0543) Pulse Rate:  [80-97] 80 (09/09 0543) Resp:  [17-18] 18 (09/09 0543) BP: (110-134)/(70-77) 120/71 mmHg (09/09 0543) SpO2:  [97 %-100 %] 98 % (09/09 0543) Last BM Date: 11/17/14  Intake/Output from previous day: 09/08 0701 - 09/09 0700 In: 2058.3 [I.V.:1858.3; IV Piggyback:200] Out: 1400 [Urine:1300; Emesis/NG output:100] Intake/Output this shift:    PE: Gen:  Alert, NAD, pleasant Abd: Soft, mild distension, moderate tenderness in LLQ and suprapubic region, +BS, no HSM, no abdominal scars noted   Lab Results:   Recent Labs  11/18/14 0650 11/19/14 0710  WBC 12.9* 13.3*  HGB 12.7* 13.1  HCT 37.1* 38.9*  PLT 216 220   BMET  Recent Labs  11/17/14 1825 11/19/14 0710  NA 137 136  K 4.2 4.5  CL 103 106  CO2 27 22  GLUCOSE 157* 121*  BUN 12 14  CREATININE 0.97 0.81  CALCIUM 8.9 8.2*   PT/INR No results for input(s): LABPROT, INR in the last 72 hours. CMP     Component Value Date/Time   NA 136 11/19/2014 0710   K 4.5 11/19/2014 0710   CL 106 11/19/2014 0710   CO2 22 11/19/2014 0710   GLUCOSE 121* 11/19/2014 0710   BUN 14 11/19/2014 0710   CREATININE 0.81 11/19/2014 0710   CALCIUM 8.2* 11/19/2014 0710   PROT 6.7 11/19/2014 0710   ALBUMIN 3.2* 11/19/2014 0710   AST 20 11/19/2014 0710   ALT 17 11/19/2014 0710   ALKPHOS 62 11/19/2014 0710   BILITOT 1.4* 11/19/2014 0710   GFRNONAA >60 11/19/2014 0710   GFRAA >60 11/19/2014 0710   Lipase     Component Value Date/Time   LIPASE 23 11/17/2014 1825       Studies/Results: Nm Hepatobiliary Including Gb  11/18/2014   CLINICAL DATA:  Cholecystitis  EXAM: NUCLEAR MEDICINE  HEPATOBILIARY IMAGING  TECHNIQUE: Sequential images of the abdomen were obtained out to 60 minutes following intravenous administration of radiopharmaceutical.  RADIOPHARMACEUTICALS:  5.1 mCi Tc-63m  Choletec IV  COMPARISON:  Ultrasound 11/18/2014  FINDINGS: There is normal uptake of the tracer by the liver. CBD visualized at 10 minutes. Gallbladder visualized at 20 minutes. Bowel activity noted at 15 minutes.  IMPRESSION: No cystic duct obstruction.  Gallbladder visualized at 20 minutes.   Electronically Signed   By: Natasha Mead M.D.   On: 11/18/2014 13:50    Anti-infectives: Anti-infectives    Start     Dose/Rate Route Frequency Ordered Stop   11/20/14 0730  ertapenem (INVANZ) 1 g in sodium chloride 0.9 % 50 mL IVPB     1 g 100 mL/hr over 30 Minutes Intravenous Every 24 hours 11/20/14 0723     11/18/14 2200  cefTRIAXone (ROCEPHIN) 2 g in dextrose 5 % 50 mL IVPB  Status:  Discontinued     2 g 100 mL/hr over 30 Minutes Intravenous Daily at bedtime 11/18/14 0254 11/20/14 0723   11/18/14 0800  metroNIDAZOLE (FLAGYL) IVPB 500 mg  Status:  Discontinued     500 mg 100 mL/hr over 60 Minutes Intravenous Every 8 hours 11/18/14 0254 11/20/14 0723   11/17/14 2345  ciprofloxacin (CIPRO) IVPB 400 mg     400 mg 200 mL/hr over 60 Minutes Intravenous  Once 11/17/14 2334 11/18/14 0212   11/17/14 2345  metroNIDAZOLE (FLAGYL) IVPB 500 mg     500 mg 100 mL/hr over 60 Minutes Intravenous  Once 11/17/14 2334 11/18/14 0044       Assessment/Plan Cholelithiasis -HIDA negative, no plans for cholecystectomy at this point -Follow up with Dr. Derrell Lolling as an outpatient to discuss lap chole at later date if desired  Diverticulitis -IVF, pain control (wean to orals) -IV antibiotics Rocephin/flagyl 2/2 days, switch to [employer] today Day #1 -Hold on diet for since still has significant pain -Ambulate and IS -SCD's and lovenox -Repeat CBC and if still elevated will repeat CT scan toay  Hematuria/Proteinuria -Dr.  Ronne Binning saw and did reassurance with the patient, don't see a note from them, has had h/o BPH in past -CT doesn't note kidney stones    LOS: 2 days    Nonie Hoyer 11/20/2014, 7:32 AM Pager: [phone]

## 2014-11-20 NOTE — Consult Note (Signed)
Urology Consult  Referring physician: Jorje Guild, MD Reason for referral: BPH and microhematuria  Chief Complaint: urinary frequency  History of Present Illness: Mr [name] is a 59yo with a hx of diverticulitis and BPH who was admitted with diverticulitis. He underwent NM hepatobiliary scan for his gallbladder and since the procedure his urine has been dark red/orange. UA was obtained which showed blood and micro showed 3-6 RBCs/hpf. He has significant LUTS which have been present for over 5 years. His biggest compliants are frequency, urgency, and weak stream. He denies dysuria, gross hematuria. He was previously treated with flomax and uroxatrol which he stopped due to dizziness and retrograde ejaculation. Currently he has mild abdominal pain. He has urinated 3 times today and every sample was dark red/orange. His PSA was normal per the patient. No hx of UTI. No hx of nephrolithiasis. CT scan with pelvis showed no GU abnormalities.  Past Medical History  Diagnosis Date  . Diverticulitis   . GERD (gastroesophageal reflux disease)    History reviewed. No pertinent past surgical history.  Medications: I have reviewed the patient's current medications. Allergies:  Allergies  Allergen Reactions  . Food     LIMA BEANS/COCONUT-NAUSEA/VOMITING    History reviewed. No pertinent family history. Social History:  reports that he has quit smoking. He does not have any smokeless tobacco history on file. He reports that he does not drink alcohol. His drug history is not on file.  Review of Systems  Gastrointestinal: Positive for nausea and abdominal pain.  Genitourinary: Positive for urgency and frequency.  All other systems reviewed and are negative.   Physical Exam:  Vital signs in last 24 hours: Temp:  [98.2 F (36.8 C)-99 F (37.2 C)] 99 F (37.2 C) (09/09 1400) Pulse Rate:  [80-89] 89 (09/09 1400) Resp:  [18] 18 (09/09 1400) BP: (110-140)/(70-79) 140/79 mmHg (09/09 1400) SpO2:  [97  %-100 %] 98 % (09/09 1400) Physical Exam  Constitutional: He is oriented to person, place, and time. He appears well-developed and well-nourished.  HENT:  Head: Normocephalic and atraumatic.  Eyes: EOM are normal. Pupils are equal, round, and reactive to light.  Neck: Normal range of motion. No thyromegaly present.  Cardiovascular: Normal rate and regular rhythm.   Respiratory: Effort normal. No respiratory distress.  GI: Soft. He exhibits no distension. There is no tenderness. Hernia confirmed negative in the right inguinal area and confirmed negative in the left inguinal area.  Genitourinary: Rectum normal, testes normal and penis normal. Rectal exam shows anal tone normal. Prostate is enlarged. Prostate is not tender. Cremasteric reflex is present. Right testis shows no mass. Left testis shows no mass. Circumcised.  Musculoskeletal: Normal range of motion.  Neurological: He is alert and oriented to person, place, and time.  Skin: Skin is warm and dry.  Psychiatric: He has a normal mood and affect. His behavior is normal. Judgment and thought content normal.    Laboratory Data:  Results for orders placed or performed during the hospital encounter of 11/17/14 (from the past 72 hour(s))  Lipase, blood     Status: None   Collection Time: 11/17/14  6:25 PM  Result Value Ref Range   Lipase 23 22 - 51 U/L  Comprehensive metabolic panel     Status: Abnormal   Collection Time: 11/17/14  6:25 PM  Result Value Ref Range   Sodium 137 135 - 145 mmol/L   Potassium 4.2 3.5 - 5.1 mmol/L   Chloride 103 101 - 111 mmol/L  CO2 27 22 - 32 mmol/L   Glucose, Bld 157 (H) 65 - 99 mg/dL   BUN 12 6 - 20 mg/dL   Creatinine, Ser 0.97 0.61 - 1.24 mg/dL   Calcium 8.9 8.9 - 10.3 mg/dL   Total Protein 7.6 6.5 - 8.1 g/dL   Albumin 4.0 3.5 - 5.0 g/dL   AST 25 15 - 41 U/L   ALT 17 17 - 63 U/L   Alkaline Phosphatase 63 38 - 126 U/L   Total Bilirubin 1.8 (H) 0.3 - 1.2 mg/dL   GFR calc non Af Amer >60 >60  mL/min   GFR calc Af Amer >60 >60 mL/min    Comment: (NOTE) The eGFR has been calculated using the CKD EPI equation. This calculation has not been validated in all clinical situations. eGFR's persistently <60 mL/min signify possible Chronic Kidney Disease.    Anion gap 7 5 - 15  CBC     Status: Abnormal   Collection Time: 11/17/14  6:25 PM  Result Value Ref Range   WBC 13.3 (H) 4.0 - 10.5 K/uL   RBC 4.49 4.22 - 5.81 MIL/uL   Hemoglobin 15.0 13.0 - 17.0 g/dL   HCT 44.1 39.0 - 52.0 %   MCV 98.2 78.0 - 100.0 fL   MCH 33.4 26.0 - 34.0 pg   MCHC 34.0 30.0 - 36.0 g/dL   RDW 12.2 11.5 - 15.5 %   Platelets 254 150 - 400 K/uL  Urinalysis, Routine w reflex microscopic (not at [facility])     Status: Abnormal   Collection Time: 11/17/14  6:25 PM  Result Value Ref Range   Color, Urine AMBER (A) YELLOW    Comment: BIOCHEMICALS MAY BE AFFECTED BY COLOR   APPearance CLOUDY (A) CLEAR   Specific Gravity, Urine 1.027 1.005 - 1.030   pH 5.0 5.0 - 8.0   Glucose, UA NEGATIVE NEGATIVE mg/dL   Hgb urine dipstick SMALL (A) NEGATIVE   Bilirubin Urine SMALL (A) NEGATIVE   Ketones, ur >80 (A) NEGATIVE mg/dL   Protein, ur NEGATIVE NEGATIVE mg/dL   Urobilinogen, UA 1.0 0.0 - 1.0 mg/dL   Nitrite NEGATIVE NEGATIVE   Leukocytes, UA SMALL (A) NEGATIVE  Differential     Status: Abnormal   Collection Time: 11/17/14  6:25 PM  Result Value Ref Range   Neutrophils Relative % 92 (H) 43 - 77 %   Neutro Abs 12.3 (H) 1.7 - 7.7 K/uL   Lymphocytes Relative 5 (L) 12 - 46 %   Lymphs Abs 0.6 (L) 0.7 - 4.0 K/uL   Monocytes Relative 3 3 - 12 %   Monocytes Absolute 0.4 0.1 - 1.0 K/uL   Eosinophils Relative 0 0 - 5 %   Eosinophils Absolute 0.0 0.0 - 0.7 K/uL   Basophils Relative 0 0 - 1 %   Basophils Absolute 0.0 0.0 - 0.1 K/uL  Urine microscopic-add on     Status: Abnormal   Collection Time: 11/17/14  6:25 PM  Result Value Ref Range   Squamous Epithelial / LPF FEW (A) RARE   WBC, UA 3-6 <3 WBC/hpf   RBC / HPF 3-6 <3  RBC/hpf   Bacteria, UA RARE RARE   Urine-Other MUCOUS PRESENT   CK     Status: None   Collection Time: 11/17/14  6:25 PM  Result Value Ref Range   Total CK 98 49 - 397 U/L  CBC     Status: Abnormal   Collection Time: 11/18/14  6:50 AM  Result Value  Ref Range   WBC 12.9 (H) 4.0 - 10.5 K/uL   RBC 3.76 (L) 4.22 - 5.81 MIL/uL   Hemoglobin 12.7 (L) 13.0 - 17.0 g/dL   HCT 37.1 (L) 39.0 - 52.0 %   MCV 98.7 78.0 - 100.0 fL   MCH 33.8 26.0 - 34.0 pg   MCHC 34.2 30.0 - 36.0 g/dL   RDW 13.0 11.5 - 15.5 %   Platelets 216 150 - 400 K/uL  Urinalysis, Routine w reflex microscopic (not at [facility])     Status: Abnormal   Collection Time: 11/18/14  5:11 PM  Result Value Ref Range   Color, Urine ORANGE (A) YELLOW    Comment: BIOCHEMICALS MAY BE AFFECTED BY COLOR   APPearance CLOUDY (A) CLEAR   Specific Gravity, Urine 1.026 1.005 - 1.030   pH 5.5 5.0 - 8.0   Glucose, UA 100 (A) NEGATIVE mg/dL   Hgb urine dipstick MODERATE (A) NEGATIVE   Bilirubin Urine SMALL (A) NEGATIVE   Ketones, ur 40 (A) NEGATIVE mg/dL   Protein, ur 30 (A) NEGATIVE mg/dL   Urobilinogen, UA 1.0 0.0 - 1.0 mg/dL   Nitrite NEGATIVE NEGATIVE   Leukocytes, UA SMALL (A) NEGATIVE  Urine microscopic-add on     Status: Abnormal   Collection Time: 11/18/14  5:11 PM  Result Value Ref Range   Squamous Epithelial / LPF RARE RARE   WBC, UA 7-10 <3 WBC/hpf   RBC / HPF 3-6 <3 RBC/hpf   Bacteria, UA FEW (A) RARE   Casts HYALINE CASTS (A) NEGATIVE   Urine-Other MUCOUS PRESENT   CBC     Status: Abnormal   Collection Time: 11/19/14  7:10 AM  Result Value Ref Range   WBC 13.3 (H) 4.0 - 10.5 K/uL   RBC 3.87 (L) 4.22 - 5.81 MIL/uL   Hemoglobin 13.1 13.0 - 17.0 g/dL   HCT 38.9 (L) 39.0 - 52.0 %   MCV 100.5 (H) 78.0 - 100.0 fL   MCH 33.9 26.0 - 34.0 pg   MCHC 33.7 30.0 - 36.0 g/dL   RDW 13.5 11.5 - 15.5 %   Platelets 220 150 - 400 K/uL  Comprehensive metabolic panel     Status: Abnormal   Collection Time: 11/19/14  7:10 AM  Result Value  Ref Range   Sodium 136 135 - 145 mmol/L   Potassium 4.5 3.5 - 5.1 mmol/L   Chloride 106 101 - 111 mmol/L   CO2 22 22 - 32 mmol/L   Glucose, Bld 121 (H) 65 - 99 mg/dL   BUN 14 6 - 20 mg/dL   Creatinine, Ser 0.81 0.61 - 1.24 mg/dL   Calcium 8.2 (L) 8.9 - 10.3 mg/dL   Total Protein 6.7 6.5 - 8.1 g/dL   Albumin 3.2 (L) 3.5 - 5.0 g/dL   AST 20 15 - 41 U/L   ALT 17 17 - 63 U/L   Alkaline Phosphatase 62 38 - 126 U/L   Total Bilirubin 1.4 (H) 0.3 - 1.2 mg/dL   GFR calc non Af Amer >60 >60 mL/min   GFR calc Af Amer >60 >60 mL/min    Comment: (NOTE) The eGFR has been calculated using the CKD EPI equation. This calculation has not been validated in all clinical situations. eGFR's persistently <60 mL/min signify possible Chronic Kidney Disease.    Anion gap 8 5 - 15  Urinalysis, Routine w reflex microscopic (not at [facility])     Status: Abnormal   Collection Time: 11/19/14  7:23 AM  Result  Value Ref Range   Color, Urine ORANGE (A) YELLOW    Comment: BIOCHEMICALS MAY BE AFFECTED BY COLOR   APPearance CLOUDY (A) CLEAR   Specific Gravity, Urine 1.031 (H) 1.005 - 1.030   pH 5.5 5.0 - 8.0   Glucose, UA 100 (A) NEGATIVE mg/dL   Hgb urine dipstick MODERATE (A) NEGATIVE   Bilirubin Urine SMALL (A) NEGATIVE   Ketones, ur 40 (A) NEGATIVE mg/dL   Protein, ur 30 (A) NEGATIVE mg/dL   Urobilinogen, UA 2.0 (H) 0.0 - 1.0 mg/dL   Nitrite NEGATIVE NEGATIVE   Leukocytes, UA SMALL (A) NEGATIVE  Culture, Urine     Status: None   Collection Time: 11/19/14  7:23 AM  Result Value Ref Range   Specimen Description URINE, CLEAN CATCH    Special Requests NONE    Culture      NO GROWTH 1 DAY Performed at [facility]    Report Status 11/20/2014 FINAL   Urine microscopic-add on     Status: None   Collection Time: 11/19/14  7:23 AM  Result Value Ref Range   Squamous Epithelial / LPF RARE RARE   WBC, UA 3-6 <3 WBC/hpf   RBC / HPF 3-6 <3 RBC/hpf   Bacteria, UA RARE RARE  CBC     Status: Abnormal    Collection Time: 11/20/14  8:00 AM  Result Value Ref Range   WBC 10.6 (H) 4.0 - 10.5 K/uL   RBC 3.68 (L) 4.22 - 5.81 MIL/uL   Hemoglobin 12.4 (L) 13.0 - 17.0 g/dL   HCT 37.1 (L) 39.0 - 52.0 %   MCV 100.8 (H) 78.0 - 100.0 fL   MCH 33.7 26.0 - 34.0 pg   MCHC 33.4 30.0 - 36.0 g/dL   RDW 13.5 11.5 - 15.5 %   Platelets 226 150 - 400 K/uL   Recent Results (from the past 240 hour(s))  Culture, Urine     Status: None   Collection Time: 11/19/14  7:23 AM  Result Value Ref Range Status   Specimen Description URINE, CLEAN CATCH  Final   Special Requests NONE  Final   Culture   Final    NO GROWTH 1 DAY Performed at [facility]    Report Status 11/20/2014 FINAL  Final   Creatinine:  Recent Labs  11/17/14 1825 11/19/14 0710  CREATININE 0.97 0.81   Baseline Creatinine: unknown  Impression/Assessment: 59yo with BPH with LUTS and microhematuria  Plan:  1. We discussed alpha blocker therapy for his BPH with LUTS and the patient is interested in trying rapaflo $RemoveBefore'8mg'[id number]$  .  2. We discussed the various causes of microhematuria, both benign and malignant and we discussed the workup including office cystoscopy and urine cytology. Pt will see me 2-3 weeks after discharge for cystoscopy and urine cytology.   Keysha Damewood, [place] 11/20/2014, 4:11 PM

## 2014-11-21 NOTE — Progress Notes (Signed)
  Subjective: Much less tender today Still on ice chips Three loose bowel movements yesterday - no hematochezia or melena  Objective: Vital signs in last 24 hours: Temp:  [99 F (37.2 C)] 99 F (37.2 C) (09/10 0555) Pulse Rate:  [86-89] 86 (09/10 0555) Resp:  [16-18] 16 (09/10 0555) BP: (135-140)/(79-80) 137/80 mmHg (09/10 0555) SpO2:  [98 %] 98 % (09/10 0555) Last BM Date: 11/17/14  Intake/Output from previous day: 09/09 0701 - 09/10 0700 In: 1800 [I.V.:1800] Out: 800 [Urine:800] Intake/Output this shift:    General appearance: alert, cooperative and no distress Resp: clear to auscultation bilaterally Cardio: regular rate and rhythm, S1, S2 normal, no murmur, click, rub or gallop GI: soft, + BS; minimal tenderness in BLQ No palpable masses  Lab Results:   Recent Labs  11/19/14 0710 11/20/14 0800  WBC 13.3* 10.6*  HGB 13.1 12.4*  HCT 38.9* 37.1*  PLT 220 226   BMET  Recent Labs  11/19/14 0710  NA 136  K 4.5  CL 106  CO2 22  GLUCOSE 121*  BUN 14  CREATININE 0.81  CALCIUM 8.2*   PT/INR No results for input(s): LABPROT, INR in the last 72 hours. ABG No results for input(s): PHART, HCO3 in the last 72 hours.  Invalid input(s): PCO2, PO2  Studies/Results: No results found.  Anti-infectives: Anti-infectives    Start     Dose/Rate Route Frequency Ordered Stop   11/20/14 0800  ertapenem (INVANZ) 1 g in sodium chloride 0.9 % 50 mL IVPB     1 g 100 mL/hr over 30 Minutes Intravenous Every 24 hours 11/20/14 0723     11/18/14 2200  cefTRIAXone (ROCEPHIN) 2 g in dextrose 5 % 50 mL IVPB  Status:  Discontinued     2 g 100 mL/hr over 30 Minutes Intravenous Daily at bedtime 11/18/14 0254 11/20/14 0723   11/18/14 0800  metroNIDAZOLE (FLAGYL) IVPB 500 mg  Status:  Discontinued     500 mg 100 mL/hr over 60 Minutes Intravenous Every 8 hours 11/18/14 0254 11/20/14 0723   11/17/14 2345  ciprofloxacin (CIPRO) IVPB 400 mg     400 mg 200 mL/hr over 60 Minutes  Intravenous  Once 11/17/14 2334 11/18/14 0212   11/17/14 2345  metroNIDAZOLE (FLAGYL) IVPB 500 mg     500 mg 100 mL/hr over 60 Minutes Intravenous  Once 11/17/14 2334 11/18/14 0044      Assessment/Plan: s/p * No surgery found * Sigmoid diverticulitis - improving on Invanz Cholelithiasis - no sign of acute cholecystitis; negative HIDA Hematuria - per Urology  Clear liquids If still asymptomatic tomorrow, will advance diet, change to PO abx, possible discharge Monday.   LOS: 3 days    Neida Ellegood K. 11/21/2014

## 2014-11-22 MED ORDER — SULFAMETHOXAZOLE-TRIMETHOPRIM 800-160 MG PO TABS
1.0000 | ORAL_TABLET | Freq: Two times a day (BID) | ORAL | Status: DC
  Administered 2014-11-22 – 2014-11-24 (×5): 1 via ORAL
  Filled 2014-11-22 (×6): qty 1

## 2014-11-22 NOTE — Progress Notes (Signed)
Dr. Maisie Fus aware of IV loss due to leaking at site. Received order to dc IV and leave out.

## 2014-11-22 NOTE — Progress Notes (Signed)
Patient ID: [name], male   DOB: [birth year], 59 y.o.   MRN: [id number]  Progress Note: General Surgery Service   Subjective: Pain improved, tolerated clear liquids well, a few small BMs overnight  Objective: Vital signs in last 24 hours: Temp:  [98.2 F (36.8 C)-99.5 F (37.5 C)] 99.5 F (37.5 C) (09/11 0542) Pulse Rate:  [83-88] 88 (09/11 0542) Resp:  [18] 18 (09/11 0542) BP: (131-145)/(83-84) 131/83 mmHg (09/11 0542) SpO2:  [98 %-100 %] 99 % (09/11 0542) Last BM Date: 11/21/14  Intake/Output from previous day: 09/10 0701 - 09/11 0700 In: 3175.4 [P.O.:720; I.V.:2455.4] Out: 800 [Urine:800] Intake/Output this shift:    Lungs: CTAB  Abd: soft, min tenderness suprapubically  Extremities: no edema  Neuro: AOx4, GCS 15  Lab Results: CBC   Recent Labs  11/20/14 0800  WBC 10.6*  HGB 12.4*  HCT 37.1*  PLT 226   BMET No results for input(s): NA, K, CL, CO2, GLUCOSE, BUN, CREATININE, CALCIUM in the last 72 hours. PT/INR No results for input(s): LABPROT, INR in the last 72 hours. ABG No results for input(s): PHART, HCO3 in the last 72 hours.  Invalid input(s): PCO2, PO2  Studies/Results: No results found.  Anti-infectives: Anti-infectives    Start     Dose/Rate Route Frequency Ordered Stop   11/22/14 1000  sulfamethoxazole-trimethoprim (BACTRIM DS,SEPTRA DS) 800-160 MG per tablet 1 tablet     1 tablet Oral Every 12 hours 11/22/14 0758     11/20/14 0800  ertapenem (INVANZ) 1 g in sodium chloride 0.9 % 50 mL IVPB  Status:  Discontinued     1 g 100 mL/hr over 30 Minutes Intravenous Every 24 hours 11/20/14 0723 11/22/14 0758   11/18/14 2200  cefTRIAXone (ROCEPHIN) 2 g in dextrose 5 % 50 mL IVPB  Status:  Discontinued     2 g 100 mL/hr over 30 Minutes Intravenous Daily at bedtime 11/18/14 0254 11/20/14 0723   11/18/14 0800  metroNIDAZOLE (FLAGYL) IVPB 500 mg  Status:  Discontinued     500 mg 100 mL/hr over 60 Minutes Intravenous Every 8 hours 11/18/14 0254  11/20/14 0723   11/17/14 2345  ciprofloxacin (CIPRO) IVPB 400 mg     400 mg 200 mL/hr over 60 Minutes Intravenous  Once 11/17/14 2334 11/18/14 0212   11/17/14 2345  metroNIDAZOLE (FLAGYL) IVPB 500 mg     500 mg 100 mL/hr over 60 Minutes Intravenous  Once 11/17/14 2334 11/18/14 0044      Medicaions: Scheduled Meds: . enoxaparin (LOVENOX) injection  40 mg Subcutaneous QHS  . sulfamethoxazole-trimethoprim  1 tablet Oral Q12H   Continuous Infusions: . 0.9 % NaCl with KCl 20 mEq / L 100 mL/hr at 11/21/14 2323   PRN Meds:.acetaminophen **OR** acetaminophen, HYDROcodone-acetaminophen, HYDROmorphone (DILAUDID) injection, morphine injection, ondansetron **OR** ondansetron (ZOFRAN) IV, technetium TC 50M mebrofenin  Assessment/Plan: Patient Active Problem List   Diagnosis Date Noted  . Diverticulitis 11/18/2014   Improving on antibiotics -change to PO antibiotics -continue clear liquids  LOS: 4 days   Rodman Pickle, MD Pg# [phone] [facility]

## 2014-11-23 LAB — CBC
HCT: 38.7 % — ABNORMAL LOW (ref 39.0–52.0)
Hemoglobin: 13.1 g/dL (ref 13.0–17.0)
MCH: 33.7 pg (ref 26.0–34.0)
MCHC: 33.9 g/dL (ref 30.0–36.0)
MCV: 99.5 fL (ref 78.0–100.0)
PLATELETS: 326 10*3/uL (ref 150–400)
RBC: 3.89 MIL/uL — AB (ref 4.22–5.81)
RDW: 13.2 % (ref 11.5–15.5)
WBC: 9.1 10*3/uL (ref 4.0–10.5)

## 2014-11-23 MED ORDER — TAMSULOSIN HCL 0.4 MG PO CAPS
0.4000 mg | ORAL_CAPSULE | Freq: Every day | ORAL | Status: DC
  Administered 2014-11-23 – 2014-11-30 (×8): 0.4 mg via ORAL
  Filled 2014-11-23 (×10): qty 1

## 2014-11-23 NOTE — Progress Notes (Signed)
[facility] Progress Note     Subjective: Pt denies any N/V, was only 1/10 pain, but now states he's having intermittent sharp pain with attempting BM's.  Ambulating well, lots of laps in hallway.  Having loose BM's regularly.  Having flatus.  Urinating more normally.    Objective: Vital signs in last 24 hours: Temp:  [98.7 F (37.1 C)-99.6 F (37.6 C)] 98.9 F (37.2 C) (09/12 0605) Pulse Rate:  [79-85] 85 (09/12 0605) Resp:  [18] 18 (09/12 0605) BP: (136-146)/(73-84) 136/83 mmHg (09/12 0605) SpO2:  [98 %-100 %] 98 % (09/12 0605) Last BM Date: 11/21/14  Intake/Output from previous day: 09/11 0701 - 09/12 0700 In: 1390 [P.O.:240; I.V.:1150] Out: 1750 [Urine:1750] Intake/Output this shift:    PE: Gen:  Alert, NAD, pleasant Abd: Soft, mild tenderness in suprapubic and LLQ, ND, +BS, no HSM   Lab Results:   Recent Labs  11/20/14 0800  WBC 10.6*  HGB 12.4*  HCT 37.1*  PLT 226   BMET No results for input(s): NA, K, CL, CO2, GLUCOSE, BUN, CREATININE, CALCIUM in the last 72 hours. PT/INR No results for input(s): LABPROT, INR in the last 72 hours. CMP     Component Value Date/Time   NA 136 11/19/2014 0710   K 4.5 11/19/2014 0710   CL 106 11/19/2014 0710   CO2 22 11/19/2014 0710   GLUCOSE 121* 11/19/2014 0710   BUN 14 11/19/2014 0710   CREATININE 0.81 11/19/2014 0710   CALCIUM 8.2* 11/19/2014 0710   PROT 6.7 11/19/2014 0710   ALBUMIN 3.2* 11/19/2014 0710   AST 20 11/19/2014 0710   ALT 17 11/19/2014 0710   ALKPHOS 62 11/19/2014 0710   BILITOT 1.4* 11/19/2014 0710   GFRNONAA >60 11/19/2014 0710   GFRAA >60 11/19/2014 0710   Lipase     Component Value Date/Time   LIPASE 23 11/17/2014 1825       Studies/Results: No results found.  Anti-infectives: Anti-infectives    Start     Dose/Rate Route Frequency Ordered Stop   11/22/14 1000  sulfamethoxazole-trimethoprim (BACTRIM DS,SEPTRA DS) 800-160 MG per tablet 1 tablet     1 tablet Oral Every  12 hours 11/22/14 0758     11/20/14 0800  ertapenem (INVANZ) 1 g in sodium chloride 0.9 % 50 mL IVPB  Status:  Discontinued     1 g 100 mL/hr over 30 Minutes Intravenous Every 24 hours 11/20/14 0723 11/22/14 0758   11/18/14 2200  cefTRIAXone (ROCEPHIN) 2 g in dextrose 5 % 50 mL IVPB  Status:  Discontinued     2 g 100 mL/hr over 30 Minutes Intravenous Daily at bedtime 11/18/14 0254 11/20/14 0723   11/18/14 0800  metroNIDAZOLE (FLAGYL) IVPB 500 mg  Status:  Discontinued     500 mg 100 mL/hr over 60 Minutes Intravenous Every 8 hours 11/18/14 0254 11/20/14 0723   11/17/14 2345  ciprofloxacin (CIPRO) IVPB 400 mg     400 mg 200 mL/hr over 60 Minutes Intravenous  Once 11/17/14 2334 11/18/14 0212   11/17/14 2345  metroNIDAZOLE (FLAGYL) IVPB 500 mg     500 mg 100 mL/hr over 60 Minutes Intravenous  Once 11/17/14 2334 11/18/14 0044       Assessment/Plan Diverticulitis -IVF, pain control with orals -IV antibiotics Rocephin/flagyl 2/2 days, Invanz 2/2, now on Bactrim PO Day #2 -On clears, advance to fulls, soft at dinner if tolerating -Ambulate and IS -SCD's and lovenox -Repeat CBC and Repeat CT scan tomorrow, last CT was 11/17/14.  The patient is pending a work trip to [facility] on Saturday, therefore, I think we need to be more aggressive with follow-up to try and avoid needing hospitalization out of town.  Cholelithiasis -HIDA negative, no plans for cholecystectomy at this point -Follow up with Dr. Derrell Lolling as an outpatient to discuss lap chole at later date if desired  Hematuria/Proteinuria/BPH -Dr. Ronne Binning saw and did reassurance with the patient -CT doesn't note kidney stones  -Urology recommended Rapaflo 8mg , start on flomax since not able to be started on rapaflo in hospital, will switch to this at discharge.  Pending follow up with Urology in 2-3 weeks for cystoscopy and urine cytology    LOS: 5 days    [name] 11/23/2014, 7:25 AM Pager: [phone]

## 2014-11-24 ENCOUNTER — Inpatient Hospital Stay (HOSPITAL_COMMUNITY): Payer: BLUE CROSS/BLUE SHIELD

## 2014-11-24 ENCOUNTER — Encounter (HOSPITAL_COMMUNITY): Payer: Self-pay | Admitting: Radiology

## 2014-11-24 DIAGNOSIS — R319 Hematuria, unspecified: Secondary | ICD-10-CM | POA: Diagnosis present

## 2014-11-24 DIAGNOSIS — K8020 Calculus of gallbladder without cholecystitis without obstruction: Secondary | ICD-10-CM | POA: Diagnosis present

## 2014-11-24 DIAGNOSIS — N400 Enlarged prostate without lower urinary tract symptoms: Secondary | ICD-10-CM | POA: Diagnosis present

## 2014-11-24 DIAGNOSIS — R809 Proteinuria, unspecified: Secondary | ICD-10-CM | POA: Diagnosis present

## 2014-11-24 LAB — CBC
HCT: 36.9 % — ABNORMAL LOW (ref 39.0–52.0)
Hemoglobin: 12.7 g/dL — ABNORMAL LOW (ref 13.0–17.0)
MCH: 33.4 pg (ref 26.0–34.0)
MCHC: 34.4 g/dL (ref 30.0–36.0)
MCV: 97.1 fL (ref 78.0–100.0)
PLATELETS: 350 10*3/uL (ref 150–400)
RBC: 3.8 MIL/uL — ABNORMAL LOW (ref 4.22–5.81)
RDW: 13.3 % (ref 11.5–15.5)
WBC: 10 10*3/uL (ref 4.0–10.5)

## 2014-11-24 MED ORDER — TAMSULOSIN HCL 0.4 MG PO CAPS: 0.4000 mg | ORAL_CAPSULE | Freq: Every day | ORAL | Status: DC

## 2014-11-24 MED ORDER — METRONIDAZOLE 500 MG PO TABS
500.0000 mg | ORAL_TABLET | Freq: Three times a day (TID) | ORAL | Status: DC
  Administered 2014-11-24 – 2014-11-30 (×18): 500 mg via ORAL
  Filled 2014-11-24 (×22): qty 1

## 2014-11-24 MED ORDER — IOHEXOL 300 MG/ML  SOLN
100.0000 mL | Freq: Once | INTRAMUSCULAR | Status: AC | PRN
  Administered 2014-11-24: 100 mL via INTRAVENOUS

## 2014-11-24 MED ORDER — ENOXAPARIN SODIUM 40 MG/0.4ML ~~LOC~~ SOLN
40.0000 mg | Freq: Every day | SUBCUTANEOUS | Status: DC
  Filled 2014-11-24 (×7): qty 0.4

## 2014-11-24 MED ORDER — SULFAMETHOXAZOLE-TRIMETHOPRIM 800-160 MG PO TABS: 1.0000 | ORAL_TABLET | Freq: Two times a day (BID) | ORAL | Status: DC

## 2014-11-24 MED ORDER — HYDROCODONE-ACETAMINOPHEN 5-325 MG PO TABS: 1.0000 | ORAL_TABLET | Freq: Four times a day (QID) | ORAL | Status: DC | PRN

## 2014-11-24 MED ORDER — IOHEXOL 300 MG/ML  SOLN
25.0000 mL | INTRAMUSCULAR | Status: AC
  Administered 2014-11-24 (×2): 25 mL via ORAL

## 2014-11-24 MED ORDER — CIPROFLOXACIN HCL 500 MG PO TABS
500.0000 mg | ORAL_TABLET | Freq: Two times a day (BID) | ORAL | Status: DC
  Administered 2014-11-24 – 2014-11-30 (×12): 500 mg via ORAL
  Filled 2014-11-24 (×14): qty 1

## 2014-11-24 NOTE — Discharge Instructions (Signed)
Cholelithiasis Cholelithiasis (also called gallstones) is a form of gallbladder disease in which gallstones form in your gallbladder. The gallbladder is an organ that stores bile made in the liver, which helps digest fats. Gallstones begin as small crystals and slowly grow into stones. Gallstone pain occurs when the gallbladder spasms and a gallstone is blocking the duct. Pain can also occur when a stone passes out of the duct.  RISK FACTORS  Being male.   Having multiple pregnancies. Health care providers sometimes advise removing diseased gallbladders before future pregnancies.   Being obese.  Eating a diet heavy in fried foods and fat.   Being older than 60 years and increasing age.   Prolonged use of medicines containing male hormones.   Having diabetes mellitus.   Rapidly losing weight.   Having a family history of gallstones (heredity).  SYMPTOMS  Nausea.   Vomiting.  Abdominal pain.   Yellowing of the skin (jaundice).   Sudden pain. It may persist from several minutes to several hours.  Fever.   Tenderness to the touch. In some cases, when gallstones do not move into the bile duct, people have no pain or symptoms. These are called "silent" gallstones.  TREATMENT Silent gallstones do not need treatment. In severe cases, emergency surgery may be required. Options for treatment include:  Surgery to remove the gallbladder. This is the most common treatment.  Medicines. These do not always work and may take 6-12 months or more to work.  Shock wave treatment (extracorporeal biliary lithotripsy). In this treatment an ultrasound machine sends shock waves to the gallbladder to break gallstones into smaller pieces that can pass into the intestines or be dissolved by medicine. HOME CARE INSTRUCTIONS   Only take over-the-counter or prescription medicines for pain, discomfort, or fever as directed by your health care provider.   Follow a low-fat diet until  seen again by your health care provider. Fat causes the gallbladder to contract, which can result in pain.   Follow up with your health care provider as directed. Attacks are almost always recurrent and surgery is usually required for permanent treatment.  SEEK IMMEDIATE MEDICAL CARE IF:   Your pain increases and is not controlled by medicines.   You have a fever or persistent symptoms for more than 2-3 days.   You have a fever and your symptoms suddenly get worse.   You have persistent nausea and vomiting.  MAKE SURE YOU:   Understand these instructions.  Will watch your condition.  Will get help right away if you are not doing well or get worse. Document Released: 02/23/2005 Document Revised: 10/30/2012 Document Reviewed: 08/21/2012 [facility] Patient Information 2015 [place], [place]. This information is not intended to replace advice given to you by your health care provider. Make sure you discuss any questions you have with your health care provider.   Hematuria Hematuria is blood in your urine. It can be caused by a bladder infection, kidney infection, prostate infection, kidney stone, or cancer of your urinary tract. Infections can usually be treated with medicine, and a kidney stone usually will pass through your urine. If neither of these is the cause of your hematuria, further workup to find out the reason may be needed. It is very important that you tell your health care provider about any blood you see in your urine, even if the blood stops without treatment or happens without causing pain. Blood in your urine that happens and then stops and then happens again can be a symptom  of a very serious condition. Also, pain is not a symptom in the initial stages of many urinary cancers. HOME CARE INSTRUCTIONS   Drink lots of fluid, 3-4 quarts a day. If you have been diagnosed with an infection, cranberry juice is especially recommended, in addition to large amounts of  water.  Avoid caffeine, tea, and carbonated beverages because they tend to irritate the bladder.  Avoid alcohol because it may irritate the prostate.  Take all medicines as directed by your health care provider.  If you were prescribed an antibiotic medicine, finish it all even if you start to feel better.  If you have been diagnosed with a kidney stone, follow your health care provider's instructions regarding straining your urine to catch the stone.  Empty your bladder often. Avoid holding urine for long periods of time.  After a bowel movement, women should cleanse front to back. Use each tissue only once.  Empty your bladder before and after sexual intercourse if you are a male. SEEK MEDICAL CARE IF:  You develop back pain.  You have a fever.  You have a feeling of sickness in your stomach (nausea) or vomiting.  Your symptoms are not better in 3 days. Return sooner if you are getting worse. SEEK IMMEDIATE MEDICAL CARE IF:   You develop severe vomiting and are unable to keep the medicine down.  You develop severe back or abdominal pain despite taking your medicines.  You begin passing a large amount of blood or clots in your urine.  You feel extremely weak or faint, or you pass out. MAKE SURE YOU:   Understand these instructions.  Will watch your condition.  Will get help right away if you are not doing well or get worse. Document Released: 02/27/2005 Document Revised: 07/14/2013 Document Reviewed: 10/28/2012 [facility] Patient Information 2015 [place], [place]. This information is not intended to replace advice given to you by your health care provider. Make sure you discuss any questions you have with your health care provider.   Diverticulitis Diverticulitis is inflammation or infection of small pouches in your colon that form when you have a condition called diverticulosis. The pouches in your colon are called diverticula. Your colon, or large intestine, is where  water is absorbed and stool is formed. Complications of diverticulitis can include:  Bleeding.  Severe infection.  Severe pain.  Perforation of your colon.  Obstruction of your colon. CAUSES  Diverticulitis is caused by bacteria. Diverticulitis happens when stool becomes trapped in diverticula. This allows bacteria to grow in the diverticula, which can lead to inflammation and infection. RISK FACTORS People with diverticulosis are at risk for diverticulitis. Eating a diet that does not include enough fiber from fruits and vegetables may make diverticulitis more likely to develop. SYMPTOMS  Symptoms of diverticulitis may include:  Abdominal pain and tenderness. The pain is normally located on the left side of the abdomen, but may occur in other areas.  Fever and chills.  Bloating.  Cramping.  Nausea.  Vomiting.  Constipation.  Diarrhea.  Blood in your stool. DIAGNOSIS  Your health care provider will ask you about your medical history and do a physical exam. You may need to have tests done because many medical conditions can cause the same symptoms as diverticulitis. Tests may include:  Blood tests.  Urine tests.  Imaging tests of the abdomen, including X-rays and CT scans. When your condition is under control, your health care provider may recommend that you have a colonoscopy. A colonoscopy can show  how severe your diverticula are and whether something else is causing your symptoms. TREATMENT  Most cases of diverticulitis are mild and can be treated at home. Treatment may include:  Taking over-the-counter pain medicines.  Following a clear liquid diet.  Taking antibiotic medicines by mouth for 7-10 days. More severe cases may be treated at a hospital. Treatment may include:  Not eating or drinking.  Taking prescription pain medicine.  Receiving antibiotic medicines through an IV tube.  Receiving fluids and nutrition through an IV tube.  Surgery. HOME  CARE INSTRUCTIONS   Follow your health care provider's instructions carefully.  Follow a full liquid diet or other diet as directed by your health care provider. After your symptoms improve, your health care provider may tell you to change your diet. He or she may recommend you eat a high-fiber diet. Fruits and vegetables are good sources of fiber. Fiber makes it easier to pass stool.  Take fiber supplements or probiotics as directed by your health care provider.  Only take medicines as directed by your health care provider.  Keep all your follow-up appointments. SEEK MEDICAL CARE IF:   Your pain does not improve.  You have a hard time eating food.  Your bowel movements do not return to normal. SEEK IMMEDIATE MEDICAL CARE IF:   Your pain becomes worse.  Your symptoms do not get better.  Your symptoms suddenly get worse.  You have a fever.  You have repeated vomiting.  You have bloody or black, tarry stools. MAKE SURE YOU:   Understand these instructions.  Will watch your condition.  Will get help right away if you are not doing well or get worse. Document Released: 12/07/2004 Document Revised: 03/04/2013 Document Reviewed: 01/22/2013 [facility] Patient Information 2015 [place], [place]. This information is not intended to replace advice given to you by your health care provider. Make sure you discuss any questions you have with your health care provider.  Low-Fiber Diet Fiber is found in fruits, vegetables, and whole grains. A low-fiber diet restricts fibrous foods that are not digested in the small intestine. A diet containing about 10-15 grams of fiber per day is considered low fiber. Low-fiber diets may be used to:  Promote healing and rest the bowel during intestinal flare-ups.  Prevent blockage of a partially obstructed or narrowed gastrointestinal tract.  Reduce fecal weight and volume.  Slow the movement of feces. You may be on a low-fiber diet as a transitional  diet following surgery, after an injury (trauma), or because of a short (acute) or lifelong (chronic) illness. Your health care provider will determine the length of time you need to stay on this diet.  WHAT DO I NEED TO KNOW ABOUT A LOW-FIBER DIET? Always check the fiber content on the packaging's Nutrition Facts label, especially on foods from the grains list. Ask your dietitian if you have questions about specific foods that are related to your condition, especially if the food is not listed below. In general, a low-fiber food will have less than 2 g of fiber. WHAT FOODS CAN I EAT? Grains All breads and crackers made with white flour. Sweet rolls, doughnuts, waffles, pancakes, [place] toast, bagels. Pretzels, Melba toast, zwieback. Well-cooked cereals, such as cornmeal, farina, or cream cereals. Dry cereals that do not contain whole grains, fruit, or nuts, such as refined corn, wheat, rice, and oat cereals. Potatoes prepared any way without skins, plain pastas and noodles, refined white rice. Use white flour for baking and making sauces. Use allowed  list of grains for casseroles, dumplings, and puddings.  Vegetables Strained tomato and vegetable juices. Fresh lettuce, cucumber, spinach. Well-cooked (no skin or pulp) or canned vegetables, such as asparagus, bean sprouts, beets, carrots, green beans, mushrooms, potatoes, pumpkin, spinach, yellow squash, tomato sauce/puree, turnips, yams, and zucchini. Keep servings limited to  cup.  Fruits All fruit juices except prune juice. Cooked or canned fruits without skin and seeds, such as applesauce, apricots, cherries, fruit cocktail, grapefruit, grapes, mandarin oranges, melons, peaches, pears, pineapple, and plums. Fresh fruits without skin, such as apricots, avocados, bananas, melons, pineapple, nectarines, and peaches. Keep servings limited to  cup or 1 piece.  Meat and Other Protein Sources Ground or well-cooked tender beef, ham, veal, lamb, pork, or  poultry. Eggs, plain cheese. Fish, oysters, shrimp, lobster, and other seafood. Liver, organ meats. Smooth nut butters. Dairy All milk products and alternative dairy substitutes, such as soy, rice, almond, and coconut, not containing added whole nuts, seeds, or added fruit. Beverages Decaf coffee, fruit, and vegetable juices or smoothies (small amounts, with no pulp or skins, and with fruits from allowed list), sports drinks, herbal tea. Condiments Ketchup, mustard, vinegar, cream sauce, cheese sauce, cocoa powder. Spices in moderation, such as allspice, basil, bay leaves, celery powder or leaves, cinnamon, cumin powder, curry powder, ginger, mace, marjoram, onion or garlic powder, oregano, paprika, parsley flakes, ground pepper, rosemary, sage, savory, tarragon, thyme, and turmeric. Sweets and Desserts Plain cakes and cookies, pie made with allowed fruit, pudding, custard, cream pie. Gelatin, fruit, ice, sherbet, frozen ice pops. Ice cream, ice milk without nuts. Plain hard candy, honey, jelly, molasses, syrup, sugar, chocolate syrup, gumdrops, marshmallows. Limit overall sugar intake.  Fats and Oil Margarine, butter, cream, mayonnaise, salad oils, plain salad dressings made from allowed foods. Choose healthy fats such as olive oil, canola oil, and omega-3 fatty acids (such as found in salmon or tuna) when possible.  Other Bouillon, broth, or cream soups made from allowed foods. Any strained soup. Casseroles or mixed dishes made with allowed foods. The items listed above may not be a complete list of recommended foods or beverages. Contact your dietitian for more options.  WHAT FOODS ARE NOT RECOMMENDED? Grains All whole wheat and whole grain breads and crackers. Multigrains, rye, bran seeds, nuts, or coconut. Cereals containing whole grains, multigrains, bran, coconut, nuts, raisins. Cooked or dry oatmeal, steel-cut oats. Coarse wheat cereals, granola. Cereals advertised as high fiber. Potato  skins. Whole grain pasta, wild or brown rice. Popcorn. Coconut flour. Bran, buckwheat, corn bread, multigrains, rye, wheat germ.  Vegetables Fresh, cooked or canned vegetables, such as artichokes, asparagus, beet greens, broccoli, Brussels sprouts, cabbage, celery, cauliflower, corn, eggplant, kale, legumes or beans, okra, peas, and tomatoes. Avoid large servings of any vegetables, especially raw vegetables.  Fruits Fresh fruits, such as apples with or without skin, berries, cherries, figs, grapes, grapefruit, guavas, kiwis, mangoes, oranges, papayas, pears, persimmons, pineapple, and pomegranate. Prune juice and juices with pulp, stewed or dried prunes. Dried fruits, dates, raisins. Fruit seeds or skins. Avoid large servings of all fresh fruits. Meats and Other Protein Sources Tough, fibrous meats with gristle. Chunky nut butter. Cheese made with seeds, nuts, or other foods not recommended. Nuts, seeds, legumes (beans, including baked beans), dried peas, beans, lentils.  Dairy Yogurt or cheese that contains nuts, seeds, or added fruit.  Beverages Fruit juices with high pulp, prune juice. Caffeinated coffee and teas.  Condiments Coconut, maple syrup, pickles, olives. Sweets and Desserts Desserts, cookies, or candies that  contain nuts or coconut, chunky peanut butter, dried fruits. Jams, preserves with seeds, marmalade. Large amounts of sugar and sweets. Any other dessert made with fruits from the not recommended list.  Other Soups made from vegetables that are not recommended or that contain other foods not recommended.  The items listed above may not be a complete list of foods and beverages to avoid. Contact your dietitian for more information. Document Released: 08/19/2001 Document Revised: 03/04/2013 Document Reviewed: 01/20/2013 [facility] Patient Information 2015 [place], [place]. This information is not intended to replace advice given to you by your health care provider. Make sure you discuss  any questions you have with your health care provider.

## 2014-11-24 NOTE — Progress Notes (Signed)
[facility] Progress Note     Subjective: Pt feels much better.  No N/V, tolerating diet.  Ambulating well.  Having flatus and BM's.  Awaiting CT scan, he's drinking contrast.  Objective: Vital signs in last 24 hours: Temp:  [98.2 F (36.8 C)-99.2 F (37.3 C)] 98.2 F (36.8 C) (09/13 0556) Pulse Rate:  [83-94] 83 (09/13 0556) Resp:  [18] 18 (09/13 0556) BP: (124-145)/(74-78) 124/76 mmHg (09/13 0556) SpO2:  [96 %-98 %] 96 % (09/13 0556) Last BM Date: 11/23/14  Intake/Output from previous day: 09/12 0701 - 09/13 0700 In: 1680 [P.O.:1680] Out: 2750 [Urine:2750] Intake/Output this shift:    PE: Gen:  Alert, NAD, pleasant Abd: Soft, mild distension, minimally tender, +BS, no HSM   Lab Results:   Recent Labs  11/23/14 0845 11/24/14 0515  WBC 9.1 10.0  HGB 13.1 12.7*  HCT 38.7* 36.9*  PLT 326 350   BMET No results for input(s): NA, K, CL, CO2, GLUCOSE, BUN, CREATININE, CALCIUM in the last 72 hours. PT/INR No results for input(s): LABPROT, INR in the last 72 hours. CMP     Component Value Date/Time   NA 136 11/19/2014 0710   K 4.5 11/19/2014 0710   CL 106 11/19/2014 0710   CO2 22 11/19/2014 0710   GLUCOSE 121* 11/19/2014 0710   BUN 14 11/19/2014 0710   CREATININE 0.81 11/19/2014 0710   CALCIUM 8.2* 11/19/2014 0710   PROT 6.7 11/19/2014 0710   ALBUMIN 3.2* 11/19/2014 0710   AST 20 11/19/2014 0710   ALT 17 11/19/2014 0710   ALKPHOS 62 11/19/2014 0710   BILITOT 1.4* 11/19/2014 0710   GFRNONAA >60 11/19/2014 0710   GFRAA >60 11/19/2014 0710   Lipase     Component Value Date/Time   LIPASE 23 11/17/2014 1825       Studies/Results: No results found.  Anti-infectives: Anti-infectives    Start     Dose/Rate Route Frequency Ordered Stop   11/22/14 1000  sulfamethoxazole-trimethoprim (BACTRIM DS,SEPTRA DS) 800-160 MG per tablet 1 tablet     1 tablet Oral Every 12 hours 11/22/14 0758     11/20/14 0800  ertapenem (INVANZ) 1 g in sodium  chloride 0.9 % 50 mL IVPB  Status:  Discontinued     1 g 100 mL/hr over 30 Minutes Intravenous Every 24 hours 11/20/14 0723 11/22/14 0758   11/18/14 2200  cefTRIAXone (ROCEPHIN) 2 g in dextrose 5 % 50 mL IVPB  Status:  Discontinued     2 g 100 mL/hr over 30 Minutes Intravenous Daily at bedtime 11/18/14 0254 11/20/14 0723   11/18/14 0800  metroNIDAZOLE (FLAGYL) IVPB 500 mg  Status:  Discontinued     500 mg 100 mL/hr over 60 Minutes Intravenous Every 8 hours 11/18/14 0254 11/20/14 0723   11/17/14 2345  ciprofloxacin (CIPRO) IVPB 400 mg     400 mg 200 mL/hr over 60 Minutes Intravenous  Once 11/17/14 2334 11/18/14 0212   11/17/14 2345  metroNIDAZOLE (FLAGYL) IVPB 500 mg     500 mg 100 mL/hr over 60 Minutes Intravenous  Once 11/17/14 2334 11/18/14 0044       Assessment/Plan Diverticulitis -IVF, pain control with orals -IV antibiotics Rocephin/flagyl 2/2 days, Invanz 2/2, now on Bactrim PO Day #3 -Tolerated soft last night, on clears for CT scan -Ambulate and IS -SCD's and lovenox -Repeat CBC normal x 2 days and Repeat CT scan today, last CT was 11/17/14. The patient is pending a work trip to [place] on Saturday, therefore, I  think we need to be more aggressive with follow-up to try and avoid needing hospitalization out of town. -Hopefully CT will be improved and we can discharge him later today.   Cholelithiasis -HIDA negative, no plans for cholecystectomy at this point -Follow up with Dr. Derrell Lolling as an outpatient to discuss lap chole at later date if desired  Hematuria/Proteinuria/BPH -Dr. Ronne Binning saw and did reassurance with the patient -CT doesn't note kidney stones  -Urology recommended Rapaflo , start on flomax since not able to be started on rapaflo in hospital, will switch to this at discharge. Pending follow up with Urology in 2-3 weeks for cystoscopy and urine cytology    LOS: 6 days    Nonie Hoyer 11/24/2014, 7:55 AM Pager: [phone]  Addendum: The patients CT  shows multiple intraabdominal abscess compared to before and contained free air.  Dr. Daphine Deutscher had a long discussion with the patient.  They will change him to Cipro/flagyl.  See how he's doing tomorrow.  If not improved he will see if IR will drain any of the abscesses or possible OR.  Will make NPO after MN, will hold lovenox after MN.  Dr. Daphine Deutscher instructed him to cancel his business trip this weekend.  CT 11/24/14 IMPRESSION: -New moderate pneumoperitoneum and multiple abscesses in the lower abdomen and pelvis, presumably from the ruptured diverticulitis. -Cholelithiasis. -Small bilateral pleural effusions. Right lower lobe compressive atelectasis.

## 2014-11-24 NOTE — Discharge Planning (Deleted)
[facility] Discharge Summary   Patient ID: [name] MRN: [id number] DOB/AGE: 08-12-1955 59 y.o.  Admit date: 11/17/2014 Discharge date: 11/30/2014  Admitting Diagnosis: Cholelithiasis Sigmoid diverticulosis with diverticulitis, no abscess Thickening of the terminal ileum  Discharge Diagnosis Patient Active Problem List   Diagnosis Date Noted  . Abdominal abscess   . BPH (benign prostatic hyperplasia) 11/24/2014  . Hematuria 11/24/2014  . Proteinuria 11/24/2014  . Cholelithiasis 11/24/2014  . Diverticulitis 11/18/2014    Consultants Dr. Ronne Binning - Urology Dr. Fredia Sorrow - Interventional Radiology  Imaging: No results found.  Procedures None  Hospital Course:  59 y/o white male who presented to [facility] with lower abdominal pain last evening. After arrival there he developed nausea and vomiting. When seen in the emergency department he apparently had tenderness in the right upper quadrant as well as left lower quadrant. He reports the pain was mostly in the left lower quadrant and then moved to the right upper quadrant. A CT scan showed inflammatory changes around the sigmoid colon and suggested this also around the gallbladder and the terminal ileum. This morning, he reports that most of his discomfort is in the right upper quadrant. He is otherwise healthy without complaints. He had a bout of diverticulitis approximate 16 years ago which was treated with antibody. He had no CT scan at that time. He has never had a colonoscopy. Bowel movements have been normal. The pain is described as sharp and constant  Workup showed sigmoid diverticulitis without abscess and cholelithiasis with mild pericholecystic edema.  A HIDA scan was obtained which was negative for cholecystitis.  His pain remained in his LLQ and suprapubic area.  He was managed on IV Zosyn, but after 2 days on this antibiotic an no improvement he was switched to IV Invanz which he took for 2  days with improvement.  He was also seen by Urology for hematuria/proteinuria.  No kidney stones were seen on CT scan.  He was recommended to start rapaflo 8mg , however this was not on formulary for his insurance, thus we will try flomax 0.4mg  instead.  He is to follow up with Urology for a cystoscopy and urine cytology.  His WBC trended down to normal and pain minimized.  He was switched to oral Bactrim.  Diet was advanced as tolerated.  A repeat CT was obtained on 11/24/14 and showed 5 new intraabdominal abscesses and free abdominal air, patient was clinically improved despite worse CT scan. Radiologist felt only one would be amenable to drainage. Dr. Daphine Deutscher had a long discussion with the patient and he has decided to continue conservative management for now. Another CT was obtained on 11/27/14 which showed enlarging abscesses, thus IR was consulted and 2 percutaneous drains placed.  He has improved significantly since drain placement.  His WBC has normalized.  He will follow up with drain clinic in 1-2 weeks.  IR will arrange his follow up appointment.  Unfortunately the e.coli bacteria was resistent to many antibiotics, but fortunately the bacteria was sensitive to Augmentin.  Dr. Daphine Deutscher has recommended he stay on a full liquid diet at home.    On HD #14, the patient was voiding well, tolerating diet, ambulating well, pain well controlled, vital signs stable, and felt stable for discharge home.  Patient will follow up in our office with Dr. Daphine Deutscher in 1-2 weeks and knows to call with questions or concerns.  He will call to confirm appointment date/time.  HH nursing was set up to help with  dressing changes and drain management.        Medication List    TAKE these medications        amoxicillin-clavulanate 875-125 MG per tablet  Commonly known as:  AUGMENTIN  Take 1 tablet by mouth every 12 (twelve) hours.     HYDROcodone-acetaminophen 5-325 MG per tablet  Commonly known as:  NORCO/VICODIN  Take  1-2 tablets by mouth every 6 (six) hours as needed for moderate pain or severe pain.     ibuprofen 200 MG tablet  Commonly known as:  ADVIL,MOTRIN  Take 600 mg by mouth every 6 (six) hours as needed (for pain).     ranitidine 150 MG tablet  Commonly known as:  ZANTAC  Take 150 mg by mouth 2 (two) times daily.     tamsulosin 0.4 MG Caps capsule  Commonly known as:  FLOMAX  Take 1 capsule (0.4 mg total) by mouth daily after breakfast.         Follow-up Information    Follow up with MCKENZIE, PATRICK L, MD. Call in 1 week.   Specialty:  Urology   Why:  For post-hospital follow up regarding your prostate trouble, bloody urine, within 1-2 weeks.   Contact information:   [address] [place] [address] [phone]       Follow up with Luretha Murphy B, MD. Call in 2 weeks.   Specialty:  General Surgery   Why:  For post-hospital follow up.  Call to confirm your appointment date/time.  You will need a colonoscopy in 6-8 weeks from now as well.     Contact information:   [address] [place] [address] [phone]       Follow up with [facility].   Why:  drain care   Contact information:   [address] [place] [address] [phone]       Signed: [facility] [phone]  11/30/2014, 10:50 AM

## 2014-11-24 NOTE — Plan of Care (Signed)
Problem: Food- and Nutrition-Related Knowledge Deficit (NB-1.1) Goal: Nutrition education Formal process to instruct or train a patient/client in a skill or to impart knowledge to help patients/clients voluntarily manage or modify food choices and eating behavior to maintain or improve health. Outcome: Completed/Met Date Met:  11/24/14 Nutrition Education Note  RD consulted for nutrition education regarding low fiber diet.   RD provided "Low FiberNutrition Therapy" handout from the Academy of [employer]. Reviewed patient's dietary recall. Provided examples on ways to decrease fiber intake in diet. Provided list of low fiber foods to include in diet. Discouraged intake of processed foods and fried foods. Discouraged alcohol and red meat consumption. Teach back method used.  Expect good compliance. Pt with many questions.  Body mass index is 25.85 kg/(m^2). Pt meets criteria for overweight based on current BMI.  Current diet order is clear liquid diet. Labs and medications reviewed. No further nutrition interventions warranted at this time. If additional nutrition issues arise, please re-consult RD.  Clayton Bibles, MS, RD, LDN Pager: [phone] After Hours Pager: [phone]

## 2014-11-25 LAB — SURGICAL PCR SCREEN
MRSA, PCR: NEGATIVE
STAPHYLOCOCCUS AUREUS: NEGATIVE

## 2014-11-25 LAB — BASIC METABOLIC PANEL
Anion gap: 11 (ref 5–15)
BUN: 9 mg/dL (ref 6–20)
CHLORIDE: 98 mmol/L — AB (ref 101–111)
CO2: 25 mmol/L (ref 22–32)
CREATININE: 0.75 mg/dL (ref 0.61–1.24)
Calcium: 8 mg/dL — ABNORMAL LOW (ref 8.9–10.3)
GFR calc Af Amer: >60 mL/min (ref >60)
GFR calc non Af Amer: >60 mL/min (ref >60)
GLUCOSE: 102 mg/dL — AB (ref 65–99)
POTASSIUM: 4 mmol/L (ref 3.5–5.1)
SODIUM: 134 mmol/L — AB (ref 135–145)

## 2014-11-25 LAB — CBC
HCT: 37.5 % — ABNORMAL LOW (ref 39.0–52.0)
HEMOGLOBIN: 12.7 g/dL — AB (ref 13.0–17.0)
MCH: 32.9 pg (ref 26.0–34.0)
MCHC: 33.9 g/dL (ref 30.0–36.0)
MCV: 97.2 fL (ref 78.0–100.0)
Platelets: 372 10*3/uL (ref 150–400)
RBC: 3.86 MIL/uL — AB (ref 4.22–5.81)
RDW: 13.2 % (ref 11.5–15.5)
WBC: 11.9 10*3/uL — ABNORMAL HIGH (ref 4.0–10.5)

## 2014-11-25 MED ORDER — POTASSIUM CHLORIDE IN NACL 20-0.9 MEQ/L-% IV SOLN
INTRAVENOUS | Status: DC
  Administered 2014-11-25 – 2014-11-30 (×6): via INTRAVENOUS
  Filled 2014-11-25 (×15): qty 1000

## 2014-11-25 NOTE — Progress Notes (Signed)
[facility] Progress Note     Subjective: Pt feels okay, doesn't have as much pain upon mobilization.  No N/V.  Has loose BM's.  Ambulating well.  Wanted me to talk to his Aunt "Dot" who was a nurse for 30 years.  He's concerned about possibility of colostomy, but concerned about being in the hospital so long and needing to get back to work.    Objective: Vital signs in last 24 hours: Temp:  [98.1 F (36.7 C)-98.9 F (37.2 C)] 98.8 F (37.1 C) (09/14 0555) Pulse Rate:  [88-99] 88 (09/14 0555) Resp:  [17-18] 18 (09/14 0555) BP: (112-142)/(63-76) 142/76 mmHg (09/14 0555) SpO2:  [97 %-99 %] 98 % (09/14 0555) Last BM Date: 11/25/14  Intake/Output from previous day: 09/13 0701 - 09/14 0700 In: 1463.3 [P.O.:1080; I.V.:383.3] Out: 2600 [Urine:2600] Intake/Output this shift:    PE: Gen:  Alert, NAD, pleasant Abd: Soft, minimal tenderness in lower abdomen, mild distensions, +BS, no HSM   Lab Results:   Recent Labs  11/23/14 0845 11/24/14 0515  WBC 9.1 10.0  HGB 13.1 12.7*  HCT 38.7* 36.9*  PLT 326 350   BMET No results for input(s): NA, K, CL, CO2, GLUCOSE, BUN, CREATININE, CALCIUM in the last 72 hours. PT/INR No results for input(s): LABPROT, INR in the last 72 hours. CMP     Component Value Date/Time   NA 136 11/19/2014 0710   K 4.5 11/19/2014 0710   CL 106 11/19/2014 0710   CO2 22 11/19/2014 0710   GLUCOSE 121* 11/19/2014 0710   BUN 14 11/19/2014 0710   CREATININE 0.81 11/19/2014 0710   CALCIUM 8.2* 11/19/2014 0710   PROT 6.7 11/19/2014 0710   ALBUMIN 3.2* 11/19/2014 0710   AST 20 11/19/2014 0710   ALT 17 11/19/2014 0710   ALKPHOS 62 11/19/2014 0710   BILITOT 1.4* 11/19/2014 0710   GFRNONAA >60 11/19/2014 0710   GFRAA >60 11/19/2014 0710   Lipase     Component Value Date/Time   LIPASE 23 11/17/2014 1825       Studies/Results: Ct Abdomen Pelvis W Contrast  11/24/2014   CLINICAL DATA:  Diverticulitis.  EXAM: CT ABDOMEN AND PELVIS WITH  CONTRAST  TECHNIQUE: Multidetector CT imaging of the abdomen and pelvis was performed using the standard protocol following bolus administration of intravenous contrast.  CONTRAST:  OMNIPAQUE IOHEXOL 300 MG/ML  SOLN  COMPARISON:  CT 11/17/2014  FINDINGS: New small bilateral pleural effusions. Atelectasis in the right lower lobe. Heart is normal size.  Multiple gallstones are again noted within the gallbladder. Liver, spleen, pancreas, adrenals, kidneys are normal.  There is new moderate free air in the abdomen multiple focal gas and fluid collections noted in the lower abdomen and pelvis, the largest on image 58 measuring 5.1 x 3.9 cm. Continued diverticulosis and inflammatory change around the sigmoid colon compatible with active diverticulitis, the presumed source of the abscesses and free air. No evidence of bowel obstruction.  Urinary bladder is decompressed. The bladder wall appears slightly thickened thickened and hyperemic, likely secondarily inflamed from adjacent sigmoid diverticulitis.  IMPRESSION: New moderate pneumoperitoneum and multiple abscesses in the lower abdomen and pelvis, presumably from the ruptured diverticulitis.  Cholelithiasis.  Small bilateral pleural effusions. Right lower lobe compressive atelectasis.  Critical Value/emergent results were called by telephone at the time of interpretation on 11/24/2014 at 10:54 am to [facility], PA-C , who verbally acknowledged these results.   Electronically Signed   By: Charlett Nose M.D.  On: 11/24/2014 11:10    Anti-infectives: Anti-infectives    Start     Dose/Rate Route Frequency Ordered Stop   11/24/14 2000  ciprofloxacin (CIPRO) tablet 500 mg     500 mg Oral 2 times daily 11/24/14 1348     11/24/14 1400  metroNIDAZOLE (FLAGYL) tablet 500 mg     500 mg Oral 3 times per day 11/24/14 1348     11/24/14 0000  sulfamethoxazole-trimethoprim (BACTRIM DS,SEPTRA DS) 800-160 MG per tablet  Status:  Discontinued     1 tablet Oral Every 12  hours 11/24/14 0902 11/24/14    11/22/14 1000  sulfamethoxazole-trimethoprim (BACTRIM DS,SEPTRA DS) 800-160 MG per tablet 1 tablet  Status:  Discontinued     1 tablet Oral Every 12 hours 11/22/14 0758 11/24/14 1348   11/20/14 0800  ertapenem (INVANZ) 1 g in sodium chloride 0.9 % 50 mL IVPB  Status:  Discontinued     1 g 100 mL/hr over 30 Minutes Intravenous Every 24 hours 11/20/14 0723 11/22/14 0758   11/18/14 2200  cefTRIAXone (ROCEPHIN) 2 g in dextrose 5 % 50 mL IVPB  Status:  Discontinued     2 g 100 mL/hr over 30 Minutes Intravenous Daily at bedtime 11/18/14 0254 11/20/14 0723   11/18/14 0800  metroNIDAZOLE (FLAGYL) IVPB 500 mg  Status:  Discontinued     500 mg 100 mL/hr over 60 Minutes Intravenous Every 8 hours 11/18/14 0254 11/20/14 0723   11/17/14 2345  ciprofloxacin (CIPRO) IVPB 400 mg     400 mg 200 mL/hr over 60 Minutes Intravenous  Once 11/17/14 2334 11/18/14 0212   11/17/14 2345  metroNIDAZOLE (FLAGYL) IVPB 500 mg     500 mg 100 mL/hr over 60 Minutes Intravenous  Once 11/17/14 2334 11/18/14 0044     CT 11/24/14 IMPRESSION: -New moderate pneumoperitoneum and multiple abscesses in the lower abdomen and pelvis, presumably from the ruptured diverticulitis. -Cholelithiasis. -Small bilateral pleural effusions. Right lower lobe compressive atelectasis.  Assessment/Plan Complicated sigmoid diverticulitis with abscesses -IVF, pain control with orals -IV antibiotics Rocephin/flagyl 2/2 days, Invanz 2/2, on Bactrim PO Day 3/3, now on oral Cipro/flagyl per Dr. Daphine Deutscher -Tolerated soft last night, on clears for CT scan -Ambulate and IS -SCD's and lovenox -Repeat labs today -CT shows 5 new intraabdominal abscesses and free abdominal air, patient is clinically improved despite worse CT scan.  Radiologist felt only one would be amenable to drainage.  Dr. Daphine Deutscher had a long discussion with the patient and he has decided to continue conservative management for now.  If worsening then will  consider IR or surgery.  They are trying to avoid colostomy in such a young man. -Continue full liquid diet -He has already been instructed to cancel his [place] business trip  Cholelithiasis -HIDA negative, no plans for cholecystectomy at this point -Follow up with Dr. Derrell Lolling as an outpatient to discuss lap chole at later date if desired  Hematuria/Proteinuria/BPH -Dr. Ronne Binning saw and did reassurance with the patient -CT doesn't note kidney stones  -Urology recommended Rapaflo 8mg , start on flomax since not able to be started on rapaflo in hospital, will switch to this at discharge. Pending follow up with Urology in 2-3 weeks for cystoscopy and urine cytology      LOS: 7 days    Nonie Hoyer 11/25/2014, 7:50 AM Pager: [phone]

## 2014-11-26 NOTE — Progress Notes (Signed)
[facility] Progress Note     Subjective: Stable, no significant complaints  Objective: Vital signs in last 24 hours: Temp:  [98.4 F (36.9 C)-99.8 F (37.7 C)] 99.8 F (37.7 C) (09/15 0631) Pulse Rate:  [88-102] 88 (09/15 0631) Resp:  [18] 18 (09/14 2200) BP: (100-125)/(75-76) 122/75 mmHg (09/15 0631) SpO2:  [97 %-99 %] 97 % (09/15 0631) Last BM Date: 11/25/14  Intake/Output from previous day: 09/14 0701 - 09/15 0700 In: 561.7 [P.O.:120; I.V.:441.7] Out: 1525 [Urine:1525] Intake/Output this shift:    PE: Done by Dr. Daphine Deutscher  Lab Results:   Recent Labs  11/24/14 0515 11/25/14 0820  WBC 10.0 11.9*  HGB 12.7* 12.7*  HCT 36.9* 37.5*  PLT 350 372   BMET  Recent Labs  11/25/14 0820  NA 134*  K 4.0  CL 98*  CO2 25  GLUCOSE 102*  BUN 9  CREATININE 0.75  CALCIUM 8.0*   PT/INR No results for input(s): LABPROT, INR in the last 72 hours. CMP     Component Value Date/Time   NA 134* 11/25/2014 0820   K 4.0 11/25/2014 0820   CL 98* 11/25/2014 0820   CO2 25 11/25/2014 0820   GLUCOSE 102* 11/25/2014 0820   BUN 9 11/25/2014 0820   CREATININE 0.75 11/25/2014 0820   CALCIUM 8.0* 11/25/2014 0820   PROT 6.7 11/19/2014 0710   ALBUMIN 3.2* 11/19/2014 0710   AST 20 11/19/2014 0710   ALT 17 11/19/2014 0710   ALKPHOS 62 11/19/2014 0710   BILITOT 1.4* 11/19/2014 0710   GFRNONAA >60 11/25/2014 0820   GFRAA >60 11/25/2014 0820   Lipase     Component Value Date/Time   LIPASE 23 11/17/2014 1825       Studies/Results: Ct Abdomen Pelvis W Contrast  11/24/2014   CLINICAL DATA:  Diverticulitis.  EXAM: CT ABDOMEN AND PELVIS WITH CONTRAST  TECHNIQUE: Multidetector CT imaging of the abdomen and pelvis was performed using the standard protocol following bolus administration of intravenous contrast.  CONTRAST:  OMNIPAQUE IOHEXOL 300 MG/ML  SOLN  COMPARISON:  CT 11/17/2014  FINDINGS: New small bilateral pleural effusions. Atelectasis in the right lower  lobe. Heart is normal size.  Multiple gallstones are again noted within the gallbladder. Liver, spleen, pancreas, adrenals, kidneys are normal.  There is new moderate free air in the abdomen multiple focal gas and fluid collections noted in the lower abdomen and pelvis, the largest on image 58 measuring 5.1 x 3.9 cm. Continued diverticulosis and inflammatory change around the sigmoid colon compatible with active diverticulitis, the presumed source of the abscesses and free air. No evidence of bowel obstruction.  Urinary bladder is decompressed. The bladder wall appears slightly thickened thickened and hyperemic, likely secondarily inflamed from adjacent sigmoid diverticulitis.  IMPRESSION: New moderate pneumoperitoneum and multiple abscesses in the lower abdomen and pelvis, presumably from the ruptured diverticulitis.  Cholelithiasis.  Small bilateral pleural effusions. Right lower lobe compressive atelectasis.  Critical Value/emergent results were called by telephone at the time of interpretation on 11/24/2014 at 10:54 am to [facility], PA-C , who verbally acknowledged these results.   Electronically Signed   By: Charlett Nose M.D.   On: 11/24/2014 11:10    Anti-infectives: Anti-infectives    Start     Dose/Rate Route Frequency Ordered Stop   11/24/14 2000  ciprofloxacin (CIPRO) tablet 500 mg     500 mg Oral 2 times daily 11/24/14 1348     11/24/14 1400  metroNIDAZOLE (FLAGYL) tablet 500 mg  500 mg Oral 3 times per day 11/24/14 1348     11/24/14 0000  sulfamethoxazole-trimethoprim (BACTRIM DS,SEPTRA DS) 800-160 MG per tablet  Status:  Discontinued     1 tablet Oral Every 12 hours 11/24/14 0902 11/24/14    11/22/14 1000  sulfamethoxazole-trimethoprim (BACTRIM DS,SEPTRA DS) 800-160 MG per tablet 1 tablet  Status:  Discontinued     1 tablet Oral Every 12 hours 11/22/14 0758 11/24/14 1348   11/20/14 0800  ertapenem (INVANZ) 1 g in sodium chloride 0.9 % 50 mL IVPB  Status:  Discontinued     1 g 100  mL/hr over 30 Minutes Intravenous Every 24 hours 11/20/14 0723 11/22/14 0758   11/18/14 2200  cefTRIAXone (ROCEPHIN) 2 g in dextrose 5 % 50 mL IVPB  Status:  Discontinued     2 g 100 mL/hr over 30 Minutes Intravenous Daily at bedtime 11/18/14 0254 11/20/14 0723   11/18/14 0800  metroNIDAZOLE (FLAGYL) IVPB 500 mg  Status:  Discontinued     500 mg 100 mL/hr over 60 Minutes Intravenous Every 8 hours 11/18/14 0254 11/20/14 0723   11/17/14 2345  ciprofloxacin (CIPRO) IVPB 400 mg     400 mg 200 mL/hr over 60 Minutes Intravenous  Once 11/17/14 2334 11/18/14 0212   11/17/14 2345  metroNIDAZOLE (FLAGYL) IVPB 500 mg     500 mg 100 mL/hr over 60 Minutes Intravenous  Once 11/17/14 2334 11/18/14 0044       Assessment/Plan Complicated sigmoid diverticulitis with abscesses -IVF, pain control with orals -IV antibiotics Rocephin/flagyl 2/2 days, Invanz 2/2, on Bactrim PO Day 3/3, now on oral Cipro/flagyl per Dr. Daphine Deutscher -On fulls -Ambulate and IS -SCD's and lovenox -CBC mildly elevated at 11.9, repeat tomrorow -CT 11/24/14 shows 5 new intraabdominal abscesses and free abdominal air, patient is clinically improved despite worse CT scan. Radiologist felt only one would be amenable to drainage. Dr. Daphine Deutscher had a long discussion with the patient and he has decided to continue conservative management for now. If worsening then will consider IR or surgery. They are trying to avoid colostomy in such a young man. -He has already been instructed to cancel his [place] business trip -Dr. Daphine Deutscher would like to repeat CT tomorrow if he's not continuing to improve  Cholelithiasis -HIDA negative, no plans for cholecystectomy at this point -Follow up with Dr. Derrell Lolling as an outpatient to discuss lap chole at later date if desired  Hematuria/Proteinuria/BPH -Dr. Ronne Binning saw and did reassurance with the patient -CT doesn't note kidney stones  -Urology recommended Rapaflo 8mg , start on flomax since not able to be  started on rapaflo in hospital, will switch to this at discharge. Pending follow up with Urology in 2-3 weeks for cystoscopy and urine cytology  Disp - possibly Friday or this weekend    LOS: 8 days    Nonie Hoyer 11/26/2014, 9:05 AM Pager: [phone]

## 2014-11-27 ENCOUNTER — Inpatient Hospital Stay (HOSPITAL_COMMUNITY): Payer: BLUE CROSS/BLUE SHIELD

## 2014-11-27 ENCOUNTER — Encounter (HOSPITAL_COMMUNITY): Payer: Self-pay | Admitting: Radiology

## 2014-11-27 DIAGNOSIS — IMO0002 Reserved for concepts with insufficient information to code with codable children: Secondary | ICD-10-CM | POA: Diagnosis not present

## 2014-11-27 LAB — BASIC METABOLIC PANEL
Anion gap: 10 (ref 5–15)
BUN: 12 mg/dL (ref 6–20)
CALCIUM: 8.7 mg/dL — AB (ref 8.9–10.3)
CO2: 30 mmol/L (ref 22–32)
CREATININE: 0.99 mg/dL (ref 0.61–1.24)
Chloride: 95 mmol/L — ABNORMAL LOW (ref 101–111)
GFR calc Af Amer: >60 mL/min (ref >60)
GLUCOSE: 114 mg/dL — AB (ref 65–99)
Potassium: 4.3 mmol/L (ref 3.5–5.1)
Sodium: 135 mmol/L (ref 135–145)

## 2014-11-27 LAB — PROTIME-INR
INR: 1.24 (ref 0.00–1.49)
Prothrombin Time: 15.8 seconds — ABNORMAL HIGH (ref 11.6–15.2)

## 2014-11-27 LAB — CBC
HCT: 38.2 % — ABNORMAL LOW (ref 39.0–52.0)
Hemoglobin: 13.1 g/dL (ref 13.0–17.0)
MCH: 33.2 pg (ref 26.0–34.0)
MCHC: 34.3 g/dL (ref 30.0–36.0)
MCV: 96.7 fL (ref 78.0–100.0)
PLATELETS: 460 10*3/uL — AB (ref 150–400)
RBC: 3.95 MIL/uL — ABNORMAL LOW (ref 4.22–5.81)
RDW: 13.1 % (ref 11.5–15.5)
WBC: 13.9 10*3/uL — AB (ref 4.0–10.5)

## 2014-11-27 LAB — APTT: aPTT: 43 seconds — ABNORMAL HIGH (ref 24–37)

## 2014-11-27 MED ORDER — MIDAZOLAM HCL 2 MG/2ML IJ SOLN
INTRAMUSCULAR | Status: AC
Start: 2014-11-27 — End: 2014-11-27
  Filled 2014-11-27: qty 6

## 2014-11-27 MED ORDER — IOHEXOL 300 MG/ML  SOLN
25.0000 mL | INTRAMUSCULAR | Status: AC
  Administered 2014-11-27 (×2): 25 mL via ORAL

## 2014-11-27 MED ORDER — IOHEXOL 300 MG/ML  SOLN
100.0000 mL | Freq: Once | INTRAMUSCULAR | Status: AC | PRN
  Administered 2014-11-27: 100 mL via INTRAVENOUS

## 2014-11-27 MED ORDER — MIDAZOLAM HCL 2 MG/2ML IJ SOLN
INTRAMUSCULAR | Status: AC | PRN
Start: 2014-11-27 — End: 2014-11-27
  Administered 2014-11-27 (×6): 1 mg via INTRAVENOUS

## 2014-11-27 MED ORDER — FENTANYL CITRATE (PF) 100 MCG/2ML IJ SOLN
INTRAMUSCULAR | Status: AC | PRN
  Administered 2014-11-27: 50 ug via INTRAVENOUS
  Administered 2014-11-27 (×2): 25 ug via INTRAVENOUS

## 2014-11-27 MED ORDER — FENTANYL CITRATE (PF) 100 MCG/2ML IJ SOLN
INTRAMUSCULAR | Status: AC
  Filled 2014-11-27: qty 4

## 2014-11-27 NOTE — Progress Notes (Signed)
Patient ID: [name], male   DOB: 1955/08/30, 59 y.o.   MRN: [id number] [facility] Surgery Progress Note:   * No surgery found *  Subjective: Mental status is clear.  Slept well with pain meds Objective: Vital signs in last 24 hours: Temp:  [98.8 F (37.1 C)-99.4 F (37.4 C)] 98.9 F (37.2 C) (09/16 0615) Pulse Rate:  [78-96] 78 (09/16 0615) Resp:  [16] 16 (09/16 0615) BP: (116-133)/(73-84) 116/73 mmHg (09/16 0615) SpO2:  [98 %-99 %] 98 % (09/16 0615)  Intake/Output from previous day: 09/15 0701 - 09/16 0700 In: 600 [P.O.:600] Out: 1400 [Urine:1400] Intake/Output this shift:    Physical Exam: Work of breathing is not labored.  Pain is about the same  Lab Results:  Results for orders placed or performed during the hospital encounter of 11/17/14 (from the past 48 hour(s))  CBC     Status: Abnormal   Collection Time: 11/25/14  8:20 AM  Result Value Ref Range   WBC 11.9 (H) 4.0 - 10.5 K/uL   RBC 3.86 (L) 4.22 - 5.81 MIL/uL   Hemoglobin 12.7 (L) 13.0 - 17.0 g/dL   HCT 20.3 (L) 13.4 - 75.3 %   MCV 97.2 78.0 - 100.0 fL   MCH 32.9 26.0 - 34.0 pg   MCHC 33.9 30.0 - 36.0 g/dL   RDW 42.5 44.8 - 24.4 %   Platelets 372 150 - 400 K/uL  Basic metabolic panel     Status: Abnormal   Collection Time: 11/25/14  8:20 AM  Result Value Ref Range   Sodium 134 (L) 135 - 145 mmol/L   Potassium 4.0 3.5 - 5.1 mmol/L   Chloride 98 (L) 101 - 111 mmol/L   CO2 25 22 - 32 mmol/L   Glucose, Bld 102 (H) 65 - 99 mg/dL   BUN 9 6 - 20 mg/dL   Creatinine, Ser 5.49 0.61 - 1.24 mg/dL   Calcium 8.0 (L) 8.9 - 10.3 mg/dL   GFR calc non Af Amer >60 >60 mL/min   GFR calc Af Amer >60 >60 mL/min    Comment: (NOTE) The eGFR has been calculated using the CKD EPI equation. This calculation has not been validated in all clinical situations. eGFR's persistently <60 mL/min signify possible Chronic Kidney Disease.    Anion gap 11 5 - 15  CBC     Status: Abnormal   Collection Time: 11/27/14  5:32 AM   Result Value Ref Range   WBC 13.9 (H) 4.0 - 10.5 K/uL   RBC 3.95 (L) 4.22 - 5.81 MIL/uL   Hemoglobin 13.1 13.0 - 17.0 g/dL   HCT 60.6 (L) 95.9 - 88.3 %   MCV 96.7 78.0 - 100.0 fL   MCH 33.2 26.0 - 34.0 pg   MCHC 34.3 30.0 - 36.0 g/dL   RDW 84.6 86.8 - 10.8 %   Platelets 460 (H) 150 - 400 K/uL  Basic metabolic panel     Status: Abnormal   Collection Time: 11/27/14  5:32 AM  Result Value Ref Range   Sodium 135 135 - 145 mmol/L   Potassium 4.3 3.5 - 5.1 mmol/L   Chloride 95 (L) 101 - 111 mmol/L   CO2 30 22 - 32 mmol/L   Glucose, Bld 114 (H) 65 - 99 mg/dL   BUN 12 6 - 20 mg/dL   Creatinine, Ser 4.36 0.61 - 1.24 mg/dL   Calcium 8.7 (L) 8.9 - 10.3 mg/dL   GFR calc non Af Amer >60 >60 mL/min  GFR calc Af Amer >60 >60 mL/min    Comment: (NOTE) The eGFR has been calculated using the CKD EPI equation. This calculation has not been validated in all clinical situations. eGFR's persistently <60 mL/min signify possible Chronic Kidney Disease.    Anion gap 10 5 - 15    Radiology/Results: No results found.  Anti-infectives: Anti-infectives    Start     Dose/Rate Route Frequency Ordered Stop   11/24/14 2000  ciprofloxacin (CIPRO) tablet 500 mg     500 mg Oral 2 times daily 11/24/14 1348     11/24/14 1400  metroNIDAZOLE (FLAGYL) tablet 500 mg     500 mg Oral 3 times per day 11/24/14 1348     11/24/14 0000  sulfamethoxazole-trimethoprim (BACTRIM DS,SEPTRA DS) 800-160 MG per tablet  Status:  Discontinued     1 tablet Oral Every 12 hours 11/24/14 0902 11/24/14    11/22/14 1000  sulfamethoxazole-trimethoprim (BACTRIM DS,SEPTRA DS) 800-160 MG per tablet 1 tablet  Status:  Discontinued     1 tablet Oral Every 12 hours 11/22/14 0758 11/24/14 1348   11/20/14 0800  ertapenem (INVANZ) 1 g in sodium chloride 0.9 % 50 mL IVPB  Status:  Discontinued     1 g 100 mL/hr over 30 Minutes Intravenous Every 24 hours 11/20/14 0723 11/22/14 0758   11/18/14 2200  cefTRIAXone (ROCEPHIN) 2 g in dextrose 5 %  50 mL IVPB  Status:  Discontinued     2 g 100 mL/hr over 30 Minutes Intravenous Daily at bedtime 11/18/14 0254 11/20/14 0723   11/18/14 0800  metroNIDAZOLE (FLAGYL) IVPB 500 mg  Status:  Discontinued     500 mg 100 mL/hr over 60 Minutes Intravenous Every 8 hours 11/18/14 0254 11/20/14 0723   11/17/14 2345  ciprofloxacin (CIPRO) IVPB 400 mg     400 mg 200 mL/hr over 60 Minutes Intravenous  Once 11/17/14 2334 11/18/14 0212   11/17/14 2345  metroNIDAZOLE (FLAGYL) IVPB 500 mg     500 mg 100 mL/hr over 60 Minutes Intravenous  Once 11/17/14 2334 11/18/14 0044      Assessment/Plan: Problem List: Patient Active Problem List   Diagnosis Date Noted  . BPH (benign prostatic hyperplasia) 11/24/2014  . Hematuria 11/24/2014  . Proteinuria 11/24/2014  . Cholelithiasis 11/24/2014  . Diverticulitis 11/18/2014    WBC is up.  We had discussed management options and because WBC is up will repeat CT to see if there is more defined abscess that can be percutaneously drained.   * No surgery found *    LOS: 9 days   Matt B. Hassell Done, MD, [facility] [phone] beeper [phone]  11/27/2014 7:38 AM

## 2014-11-27 NOTE — Procedures (Signed)
Interventional Radiology Procedure Note  Procedure:  CT guided catheter drainage of pelvic peritoneal abscesses x 2  Complications:  None  Estimated Blood Loss: <25 mL  Findings:  Largest 2 peritoneal collections sampled with 18 G needles, yielding purulent fluid; samples sent for culture labelled pelvic abscess #1 and #2. 12 Fr perc drains placed over guidewires and attached to suction bulbs.  Good drainage of purulent fluid after placement  Plan:  Will follow drain output; flushing orders entered.    Jodi Marble. Fredia Sorrow, M.D Pager:  [phone]

## 2014-11-27 NOTE — Consult Note (Signed)
Chief Complaint: Patient was seen in consultation today for diverticular abscess Chief Complaint  Patient presents with  . Abdominal Pain   at the request of Dr Wenda Low  Referring Physician(s): Dr Daphine Deutscher  History of Present Illness: [name] is a 59 y.o. male   Diverticular disease Abscess --on antibx Still painful Wbc higher CT 11/27/14 IMPRESSION: Persistent mild to moderate free peritoneal air. Multiple mesenteric abscesses in the lower abdomen most with overall increase in size with the largest over the right lower quadrant containing air-fluid level measuring 5 x 6.6 cm (previously 3.9 x 5.1 cm). Most of these abscesses appear to communicate. Etiology of these abscesses and free air is unclear, possibly sequelae from diverticulitis of the sigmoid colon. There is evolving ileus versus obstruction of the small bowel with possible transition point over a strictured segment of distal small bowel between 2 of the above described abscess cavities in the lower abdomen just right of midline.  Request for abscess drain (s) placement Dr Fredia Sorrow has reviewed imaging and approves procedure Now scheduled for same  Past Medical History  Diagnosis Date  . Diverticulitis   . GERD (gastroesophageal reflux disease)     History reviewed. No pertinent past surgical history.  Allergies: Food  Medications: Prior to Admission medications   Medication Sig Start Date End Date Taking? Authorizing Provider  ibuprofen (ADVIL,MOTRIN) 200 MG tablet Take 600 mg by mouth every 6 (six) hours as needed (for pain).   Yes Historical Provider, MD  ranitidine (ZANTAC) 150 MG tablet Take 150 mg by mouth 2 (two) times daily.   Yes Historical Provider, MD  HYDROcodone-acetaminophen (NORCO/VICODIN) 5-325 MG per tablet Take 1-2 tablets by mouth every 6 (six) hours as needed for moderate pain or severe pain. 11/24/14   Nonie Hoyer, PA-C  tamsulosin (FLOMAX) 0.4 MG CAPS capsule Take 1  capsule (0.4 mg total) by mouth daily after breakfast. 11/24/14   Nonie Hoyer, PA-C     History reviewed. No pertinent family history.  Social History   Social History  . Marital Status: Significant Other    Spouse Name: N/A  . Number of Children: N/A  . Years of Education: N/A   Social History Main Topics  . Smoking status: Former Games developer  . Smokeless tobacco: None  . Alcohol Use: No  . Drug Use: None  . Sexual Activity: Not Asked   Other Topics Concern  . None   Social History Narrative     Review of Systems: A 12 point ROS discussed and pertinent positives are indicated in the HPI above.  All other systems are negative.  Review of Systems   Constitutional: Positive for activity change and fatigue.  Gastrointestinal: Positive for abdominal pain.  Neurological: Negative for weakness.  Psychiatric/Behavioral: Negative for behavioral problems and confusion.    Vital Signs: BP 116/73 mmHg  Pulse 78  Temp(Src) 98.9 F (37.2 C) (Oral)  Resp 16  Ht  (1.727 m)  Wt 170 lb (77.111 kg)  BMI 25.85 kg/m2  SpO2 98%  Physical Exam  Constitutional: He is oriented to person, place, and time.  Cardiovascular: Normal rate, regular rhythm and normal heart sounds.   Pulmonary/Chest: Effort normal and breath sounds normal.  Abdominal: Soft. There is tenderness.  Musculoskeletal: Normal range of motion.  Neurological: He is alert and oriented to person, place, and time.  Skin: Skin is warm and dry.  Psychiatric: He has a normal mood and affect. His behavior is normal. Judgment and  thought content normal.  Nursing note and vitals reviewed.   Mallampati Score:  MD Evaluation Airway: WNL Heart: WNL Abdomen: WNL Chest/ Lungs: WNL ASA  Classification: 2 Mallampati/Airway Score: One  Imaging: Nm Hepatobiliary Including Gb  11/18/2014   CLINICAL DATA:  Cholecystitis  EXAM: NUCLEAR MEDICINE HEPATOBILIARY IMAGING  TECHNIQUE: Sequential images of the abdomen were obtained  out to 60 minutes following intravenous administration of radiopharmaceutical.  RADIOPHARMACEUTICALS:  5.1 mCi Tc-40m  Choletec IV  COMPARISON:  Ultrasound 11/18/2014  FINDINGS: There is normal uptake of the tracer by the liver. CBD visualized at 10 minutes. Gallbladder visualized at 20 minutes. Bowel activity noted at 15 minutes.  IMPRESSION: No cystic duct obstruction.  Gallbladder visualized at 20 minutes.   Electronically Signed   By: Natasha Mead M.D.   On: 11/18/2014 13:50   Ct Abdomen Pelvis W Contrast  11/27/2014   ADDENDUM REPORT: 11/27/2014 13:00 ADDENDUM: These results were called by telephone at the time of interpretation on 11/27/2014 at 12:59 pm to Dr. Kristin Bruins nurse, Shon Baton, who verbally acknowledged these results and to indicated them to Dr. Daphine Deutscher was in her presence. Electronically Signed   By: Elberta Fortis M.D.   On: 11/27/2014 13:00  11/27/2014   CLINICAL DATA:  History of diverticulitis and abdominal abscess. No current abdominal complaints  EXAM: CT ABDOMEN AND PELVIS WITH CONTRAST  TECHNIQUE: Multidetector CT imaging of the abdomen and pelvis was performed using the standard protocol following bolus administration of intravenous contrast.  CONTRAST:  OMNIPAQUE IOHEXOL 300 MG/ML  SOLN  COMPARISON:  11/24/2014  FINDINGS: Lung bases demonstrate persistence of a small right pleural effusion with associated atelectasis. Improved tiny amount of left pleural fluid.  Abdominal images continue to demonstrate moderate free peritoneal air. There is diverticulosis of the colon with possible acute diverticulitis involving the sigmoid colon without significant change. Moderate inflammatory change and multiple metastases within the lower abdomen most slightly larger compared to the prior exam. Presumed abscess over the right lower quadrant just below the cecum is stable in size measuring 2.6 x 3.3 cm. Largest presumed abscess increased in size with air-fluid level measures 5 x 6.6 cm  (previously 3.9 x 5.1 cm). Two adjacent abscess cavities wrapped around a strictured small bowel loop just right of midline in the lower abdomen are increased in size. Most of these abscess cavities appear to communicate. There is new mild dilatation of multiple small bowel loops proximal to the strictured loop between the 2 abscesses described. There is minimal free fluid in the mesentery.  There is mild a moderate cholelithiasis. The liver, pancreas, spleen and adrenal glands are within normal. Kidneys are normal in size without hydronephrosis or nephrolithiasis. There is a sub cm left renal cortical hypodensity unchanged likely a cyst but too small to characterize. Ureters are within normal. There is calcified plaque over the abdominal aorta. Appendix not well visualized.  Pelvic images demonstrate the bladder, prostate and rectum to be normal. No free pelvic fluid.  There are minimal degenerate changes of the spine and hips.  IMPRESSION: Persistent mild to moderate free peritoneal air. Multiple mesenteric abscesses in the lower abdomen most with overall increase in size with the largest over the right lower quadrant containing air-fluid level measuring 5 x 6.6 cm (previously 3.9 x 5.1 cm). Most of these abscesses appear to communicate. Etiology of these abscesses and free air is unclear, possibly sequelae from diverticulitis of the sigmoid colon. There is evolving ileus versus obstruction of the small  bowel with possible transition point over a strictured segment of distal small bowel between 2 of the above described abscess cavities in the lower abdomen just right of midline.  Small right effusion with associated atelectasis unchanged. Improved tiny amount of left pleural fluid.  Cholelithiasis.  Sub cm left renal cortical hypodensity unchanged and likely a cyst but too small to characterize.  Diverticulosis of the colon.  Electronically Signed: By: Elberta Fortis M.D. On: 11/27/2014 12:43   Ct Abdomen Pelvis  W Contrast  11/24/2014   CLINICAL DATA:  Diverticulitis.  EXAM: CT ABDOMEN AND PELVIS WITH CONTRAST  TECHNIQUE: Multidetector CT imaging of the abdomen and pelvis was performed using the standard protocol following bolus administration of intravenous contrast.  CONTRAST:  OMNIPAQUE IOHEXOL 300 MG/ML  SOLN  COMPARISON:  CT 11/17/2014  FINDINGS: New small bilateral pleural effusions. Atelectasis in the right lower lobe. Heart is normal size.  Multiple gallstones are again noted within the gallbladder. Liver, spleen, pancreas, adrenals, kidneys are normal.  There is new moderate free air in the abdomen multiple focal gas and fluid collections noted in the lower abdomen and pelvis, the largest on image 58 measuring 5.1 x 3.9 cm. Continued diverticulosis and inflammatory change around the sigmoid colon compatible with active diverticulitis, the presumed source of the abscesses and free air. No evidence of bowel obstruction.  Urinary bladder is decompressed. The bladder wall appears slightly thickened thickened and hyperemic, likely secondarily inflamed from adjacent sigmoid diverticulitis.  IMPRESSION: New moderate pneumoperitoneum and multiple abscesses in the lower abdomen and pelvis, presumably from the ruptured diverticulitis.  Cholelithiasis.  Small bilateral pleural effusions. Right lower lobe compressive atelectasis.  Critical Value/emergent results were called by telephone at the time of interpretation on 11/24/2014 at 10:54 am to [facility], PA-C , who verbally acknowledged these results.   Electronically Signed   By: Charlett Nose M.D.   On: 11/24/2014 11:10   Ct Abdomen Pelvis W Contrast  11/17/2014   CLINICAL DATA:  Lambert Mody lower abdominal pain mostly to the left beginning yesterday and continuing today. Nausea with vomiting. Fever and chills.  EXAM: CT ABDOMEN AND PELVIS WITH CONTRAST  TECHNIQUE: Multidetector CT imaging of the abdomen and pelvis was performed using the standard protocol following  bolus administration of intravenous contrast.  CONTRAST:  OMNIPAQUE IOHEXOL 300 MG/ML SOLN, 50mL OMNIPAQUE IOHEXOL 300 MG/ML SOLN  COMPARISON:  None.  FINDINGS: Dependent type atelectasis in the lung bases. Residual contrast material in the esophagus may indicate reflux or dysmotility.  Cholelithiasis with several stones in the gallbladder. Suggestion of mild pericholecystic edema. Changes may indicate cholecystitis. No bile duct dilatation. The liver, spleen, pancreas, adrenal glands, kidneys, abdominal aorta, inferior vena cava, and retroperitoneal lymph nodes are unremarkable. Stomach, small bowel, and colon are not abnormally distended. Contrast material flows through to the colon without evidence of bowel obstruction. Suggestion of mild wall thickening in the terminal ileum could represent reactive inflammation or enteritis. No free air or free fluid in the abdomen.  Pelvis: The appendix is normal. Diverticulosis of sigmoid colon. Inflammatory infiltration in the fat around the sigmoid colon consistent with sigmoid diverticulitis. No abscess. Bladder is decompressed. Prostate gland is enlarged with calcifications. Small amount of free fluid in the pelvis is likely reactive. No destructive bone lesions.  IMPRESSION: 1. Cholelithiasis with mild pericholecystic edema. Changes are nonspecific but could indicate cholecystitis. 2. Sigmoid diverticulosis with diverticulitis.  No abscess. 3. Thickening of the wall of the terminal ileum may indicate reactive  inflammation or enteritis. No obstruction.   Electronically Signed   By: Burman Nieves M.D.   On: 11/17/2014 23:30   US Abdomen Limited Ruq  11/18/2014   CLINICAL DATA:  Left lower quadrant pain and right lower quadrant pain for 12 hours.  EXAM: US ABDOMEN LIMITED - RIGHT UPPER QUADRANT  COMPARISON:  CT abdomen and pelvis 11/17/2014  FINDINGS: Gallbladder:  Cholelithiasis with multiple stones in the gallbladder, largest measuring about 1.5 cm diameter.  Diffuse gallbladder wall thickening up to 4 mm with mild edema. Murphy's sign is negative. However, patient has had pain medications, limiting the sensitivity of Murphy's sign.  Common bile duct:  Diameter: 5 mm, normal  Liver:  No focal lesion identified. Within normal limits in parenchymal echogenicity.  IMPRESSION: Cholelithiasis with edematous gallbladder wall thickening, likely indicating acute cholecystitis.   Electronically Signed   By: Burman Nieves M.D.   On: 11/18/2014 00:34    Labs:  CBC:  Recent Labs  11/23/14 0845 11/24/14 0515 11/25/14 0820 11/27/14 0532  WBC 9.1 10.0 11.9* 13.9*  HGB 13.1 12.7* 12.7* 13.1  HCT 38.7* 36.9* 37.5* 38.2*  PLT 326 350 372 460*    COAGS: No results for input(s): INR, APTT in the last 8760 hours.  BMP:  Recent Labs  11/17/14 1825 11/19/14 0710 11/25/14 0820 11/27/14 0532  NA 137 136 134* 135  K 4.2 4.5 4.0 4.3  CL 103 106 98* 95*  CO2 27 22 25 30   GLUCOSE 157* 121* 102* 114*  BUN 12 14 9 12   CALCIUM 8.9 8.2* 8.0* 8.7*  CREATININE 0.97 0.81 0.75 0.99  GFRNONAA >60 >60 >60 >60  GFRAA >60 >60 >60 >60    LIVER FUNCTION TESTS:  Recent Labs  11/17/14 1825 11/19/14 0710  BILITOT 1.8* 1.4*  AST 25 20  ALT 17 17  ALKPHOS 63 62  PROT 7.6 6.7  ALBUMIN 4.0 3.2*    TUMOR MARKERS: No results for input(s): AFPTM, CEA, CA199, CHROMGRNA in the last 8760 hours.  Assessment and Plan:  Diverticular disease Abscesses on CT Now scheduled for drain(s) Risks and Benefits discussed with the patient including bleeding, infection, damage to adjacent structures, bowel perforation/fistula connection, and sepsis. All of the patient's questions were answered, patient is agreeable to proceed. Consent signed and in chart.   Thank you for this interesting consult.  I greatly enjoyed meeting Casy Brunetto and look forward to participating in their care.  A copy of this report was sent to the requesting provider on this  date.  Signed: TURPIN,PAMELA A 11/27/2014, 2:38 PM   I spent a total of 40 Minutes    in face to face in clinical consultation, greater than 50% of which was counseling/coordinating care for abd abscess drains

## 2014-11-28 LAB — CBC WITH DIFFERENTIAL/PLATELET
Basophils Absolute: 0 10*3/uL (ref 0.0–0.1)
Basophils Relative: 0 %
EOS PCT: 3 %
Eosinophils Absolute: 0.3 10*3/uL (ref 0.0–0.7)
HEMATOCRIT: 38.6 % — AB (ref 39.0–52.0)
HEMOGLOBIN: 13.4 g/dL (ref 13.0–17.0)
LYMPHS ABS: 1.3 10*3/uL (ref 0.7–4.0)
LYMPHS PCT: 15 %
MCH: 34 pg (ref 26.0–34.0)
MCHC: 34.7 g/dL (ref 30.0–36.0)
MCV: 98 fL (ref 78.0–100.0)
MONOS PCT: 8 %
Monocytes Absolute: 0.7 10*3/uL (ref 0.1–1.0)
Neutro Abs: 6.2 10*3/uL (ref 1.7–7.7)
Neutrophils Relative %: 74 %
Platelets: 519 10*3/uL — ABNORMAL HIGH (ref 150–400)
RBC: 3.94 MIL/uL — AB (ref 4.22–5.81)
RDW: 13.2 % (ref 11.5–15.5)
WBC: 8.5 10*3/uL (ref 4.0–10.5)

## 2014-11-28 NOTE — Progress Notes (Signed)
Progress Note: General Surgery Service   Subjective: Pain much improved, tolerating clears  Objective: Vital signs in last 24 hours: Temp:  [97.8 F (36.6 C)-99.4 F (37.4 C)] 98.2 F (36.8 C) (09/17 0620) Pulse Rate:  [80-99] 84 (09/17 0620) Resp:  [12-16] 16 (09/17 0620) BP: (101-131)/(67-80) 118/73 mmHg (09/17 0620) SpO2:  [97 %-100 %] 98 % (09/17 0620) Last BM Date: 11/26/14  Intake/Output from previous day: 09/16 0701 - 09/17 0700 In: 840 [P.O.:840] Out: 1100 [Urine:950; Drains:150] Intake/Output this shift:    Lungs: CTAB  Abd: soft, approp tender, 2 drains in inferior abdomen with purulent drainage  Extremities: no edema  Neuro: AOx4  Lab Results: CBC   Recent Labs  11/25/14 0820 11/27/14 0532  WBC 11.9* 13.9*  HGB 12.7* 13.1  HCT 37.5* 38.2*  PLT 372 460*   BMET  Recent Labs  11/25/14 0820 11/27/14 0532  NA 134* 135  K 4.0 4.3  CL 98* 95*  CO2 25 30  GLUCOSE 102* 114*  BUN 9 12  CREATININE 0.75 0.99  CALCIUM 8.0* 8.7*   PT/INR  Recent Labs  11/27/14 1453  LABPROT 15.8*  INR 1.24   ABG No results for input(s): PHART, HCO3 in the last 72 hours.  Invalid input(s): PCO2, PO2  Studies/Results: Ct Abdomen Pelvis W Contrast  11/27/2014   ADDENDUM REPORT: 11/27/2014 13:00 ADDENDUM: These results were called by telephone at the time of interpretation on 11/27/2014 at 12:59 pm to Dr. Kristin Bruins nurse, Shon Baton, who verbally acknowledged these results and to indicated them to Dr. Daphine Deutscher was in her presence. Electronically Signed   By: Elberta Fortis M.D.   On: 11/27/2014 13:00  11/27/2014   CLINICAL DATA:  History of diverticulitis and abdominal abscess. No current abdominal complaints  EXAM: CT ABDOMEN AND PELVIS WITH CONTRAST  TECHNIQUE: Multidetector CT imaging of the abdomen and pelvis was performed using the standard protocol following bolus administration of intravenous contrast.  CONTRAST:  OMNIPAQUE IOHEXOL 300 MG/ML   SOLN  COMPARISON:  11/24/2014  FINDINGS: Lung bases demonstrate persistence of a small right pleural effusion with associated atelectasis. Improved tiny amount of left pleural fluid.  Abdominal images continue to demonstrate moderate free peritoneal air. There is diverticulosis of the colon with possible acute diverticulitis involving the sigmoid colon without significant change. Moderate inflammatory change and multiple metastases within the lower abdomen most slightly larger compared to the prior exam. Presumed abscess over the right lower quadrant just below the cecum is stable in size measuring 2.6 x 3.3 cm. Largest presumed abscess increased in size with air-fluid level measures 5 x 6.6 cm (previously 3.9 x 5.1 cm). Two adjacent abscess cavities wrapped around a strictured small bowel loop just right of midline in the lower abdomen are increased in size. Most of these abscess cavities appear to communicate. There is new mild dilatation of multiple small bowel loops proximal to the strictured loop between the 2 abscesses described. There is minimal free fluid in the mesentery.  There is mild a moderate cholelithiasis. The liver, pancreas, spleen and adrenal glands are within normal. Kidneys are normal in size without hydronephrosis or nephrolithiasis. There is a sub cm left renal cortical hypodensity unchanged likely a cyst but too small to characterize. Ureters are within normal. There is calcified plaque over the abdominal aorta. Appendix not well visualized.  Pelvic images demonstrate the bladder, prostate and rectum to be normal. No free pelvic fluid.  There are minimal degenerate changes of the spine  and hips.  IMPRESSION: Persistent mild to moderate free peritoneal air. Multiple mesenteric abscesses in the lower abdomen most with overall increase in size with the largest over the right lower quadrant containing air-fluid level measuring 5 x 6.6 cm (previously 3.9 x 5.1 cm). Most of these abscesses appear  to communicate. Etiology of these abscesses and free air is unclear, possibly sequelae from diverticulitis of the sigmoid colon. There is evolving ileus versus obstruction of the small bowel with possible transition point over a strictured segment of distal small bowel between 2 of the above described abscess cavities in the lower abdomen just right of midline.  Small right effusion with associated atelectasis unchanged. Improved tiny amount of left pleural fluid.  Cholelithiasis.  Sub cm left renal cortical hypodensity unchanged and likely a cyst but too small to characterize.  Diverticulosis of the colon.  Electronically Signed: By: Elberta Fortis M.D. On: 11/27/2014 12:43    Anti-infectives: Anti-infectives    Start     Dose/Rate Route Frequency Ordered Stop   11/24/14 2000  ciprofloxacin (CIPRO) tablet 500 mg     500 mg Oral 2 times daily 11/24/14 1348     11/24/14 1400  metroNIDAZOLE (FLAGYL) tablet 500 mg     500 mg Oral 3 times per day 11/24/14 1348     11/24/14 0000  sulfamethoxazole-trimethoprim (BACTRIM DS,SEPTRA DS) 800-160 MG per tablet  Status:  Discontinued     1 tablet Oral Every 12 hours 11/24/14 0902 11/24/14    11/22/14 1000  sulfamethoxazole-trimethoprim (BACTRIM DS,SEPTRA DS) 800-160 MG per tablet 1 tablet  Status:  Discontinued     1 tablet Oral Every 12 hours 11/22/14 0758 11/24/14 1348   11/20/14 0800  ertapenem (INVANZ) 1 g in sodium chloride 0.9 % 50 mL IVPB  Status:  Discontinued     1 g 100 mL/hr over 30 Minutes Intravenous Every 24 hours 11/20/14 0723 11/22/14 0758   11/18/14 2200  cefTRIAXone (ROCEPHIN) 2 g in dextrose 5 % 50 mL IVPB  Status:  Discontinued     2 g 100 mL/hr over 30 Minutes Intravenous Daily at bedtime 11/18/14 0254 11/20/14 0723   11/18/14 0800  metroNIDAZOLE (FLAGYL) IVPB 500 mg  Status:  Discontinued     500 mg 100 mL/hr over 60 Minutes Intravenous Every 8 hours 11/18/14 0254 11/20/14 0723   11/17/14 2345  ciprofloxacin (CIPRO) IVPB 400 mg      400 mg 200 mL/hr over 60 Minutes Intravenous  Once 11/17/14 2334 11/18/14 0212   11/17/14 2345  metroNIDAZOLE (FLAGYL) IVPB 500 mg     500 mg 100 mL/hr over 60 Minutes Intravenous  Once 11/17/14 2334 11/18/14 0044      Medicaions: Scheduled Meds: . ciprofloxacin  500 mg Oral BID  . enoxaparin (LOVENOX) injection  40 mg Subcutaneous QHS  . metroNIDAZOLE  500 mg Oral 3 times per day  . tamsulosin  0.4 mg Oral QPC breakfast   Continuous Infusions: . 0.9 % NaCl with KCl 20 mEq / L Stopped (11/25/14 1025)   PRN Meds:.acetaminophen **OR** acetaminophen, HYDROcodone-acetaminophen, ondansetron **OR** ondansetron (ZOFRAN) IV, technetium TC 68M mebrofenin  Assessment/Plan: Patient Active Problem List   Diagnosis Date Noted  . Abdominal abscess   . BPH (benign prostatic hyperplasia) 11/24/2014  . Hematuria 11/24/2014  . Proteinuria 11/24/2014  . Cholelithiasis 11/24/2014  . Diverticulitis 11/18/2014   s/p guided drainage of abscesses for diverticulitis -f/u labs -continue liquids Continue drain management  LOS: 10 days   De Blanch Kinsinger,  MD Pg# [phone] [facility]

## 2014-11-28 NOTE — Progress Notes (Signed)
Referring Physician(s): Dr Daphine Deutscher  Chief Complaint:  Diverticular abscesses  Subjective:  Abscess drains placed 9/16 Pt feeling less pain already today Drains intact  Allergies: Food  Medications: Prior to Admission medications   Medication Sig Start Date End Date Taking? Authorizing Provider  ibuprofen (ADVIL,MOTRIN) 200 MG tablet Take 600 mg by mouth every 6 (six) hours as needed (for pain).   Yes Historical Provider, MD  ranitidine (ZANTAC) 150 MG tablet Take 150 mg by mouth 2 (two) times daily.   Yes Historical Provider, MD  HYDROcodone-acetaminophen (NORCO/VICODIN) 5-325 MG per tablet Take 1-2 tablets by mouth every 6 (six) hours as needed for moderate pain or severe pain. 11/24/14   Nonie Hoyer, PA-C  tamsulosin (FLOMAX) 0.4 MG CAPS capsule Take 1 capsule (0.4 mg total) by mouth daily after breakfast. 11/24/14   Nonie Hoyer, PA-C     Vital Signs: BP 118/73 mmHg  Pulse 84  Temp(Src) 98.2 F (36.8 C) (Oral)  Resp 16  Ht  (1.727 m)  Wt 170 lb (77.111 kg)  BMI 25.85 kg/m2  SpO2 98%  Physical Exam  Abdominal: Soft. Bowel sounds are normal. There is tenderness.  Site of drains clean and dry Output of both bloody; serous fluid Tender directly at site abd much less pain to palpate  afeb   Nursing note and vitals reviewed.   Imaging: Ct Abdomen Pelvis W Contrast  11/27/2014   ADDENDUM REPORT: 11/27/2014 13:00 ADDENDUM: These results were called by telephone at the time of interpretation on 11/27/2014 at 12:59 pm to Dr. Kristin Bruins nurse, Shon Baton, who verbally acknowledged these results and to indicated them to Dr. Daphine Deutscher was in her presence. Electronically Signed   By: Elberta Fortis M.D.   On: 11/27/2014 13:00  11/27/2014   CLINICAL DATA:  History of diverticulitis and abdominal abscess. No current abdominal complaints  EXAM: CT ABDOMEN AND PELVIS WITH CONTRAST  TECHNIQUE: Multidetector CT imaging of the abdomen and pelvis was performed using  the standard protocol following bolus administration of intravenous contrast.  CONTRAST:  OMNIPAQUE IOHEXOL 300 MG/ML  SOLN  COMPARISON:  11/24/2014  FINDINGS: Lung bases demonstrate persistence of a small right pleural effusion with associated atelectasis. Improved tiny amount of left pleural fluid.  Abdominal images continue to demonstrate moderate free peritoneal air. There is diverticulosis of the colon with possible acute diverticulitis involving the sigmoid colon without significant change. Moderate inflammatory change and multiple metastases within the lower abdomen most slightly larger compared to the prior exam. Presumed abscess over the right lower quadrant just below the cecum is stable in size measuring 2.6 x 3.3 cm. Largest presumed abscess increased in size with air-fluid level measures 5 x 6.6 cm (previously 3.9 x 5.1 cm). Two adjacent abscess cavities wrapped around a strictured small bowel loop just right of midline in the lower abdomen are increased in size. Most of these abscess cavities appear to communicate. There is new mild dilatation of multiple small bowel loops proximal to the strictured loop between the 2 abscesses described. There is minimal free fluid in the mesentery.  There is mild a moderate cholelithiasis. The liver, pancreas, spleen and adrenal glands are within normal. Kidneys are normal in size without hydronephrosis or nephrolithiasis. There is a sub cm left renal cortical hypodensity unchanged likely a cyst but too small to characterize. Ureters are within normal. There is calcified plaque over the abdominal aorta. Appendix not well visualized.  Pelvic images demonstrate the bladder, prostate and  rectum to be normal. No free pelvic fluid.  There are minimal degenerate changes of the spine and hips.  IMPRESSION: Persistent mild to moderate free peritoneal air. Multiple mesenteric abscesses in the lower abdomen most with overall increase in size with the largest over the  right lower quadrant containing air-fluid level measuring 5 x 6.6 cm (previously 3.9 x 5.1 cm). Most of these abscesses appear to communicate. Etiology of these abscesses and free air is unclear, possibly sequelae from diverticulitis of the sigmoid colon. There is evolving ileus versus obstruction of the small bowel with possible transition point over a strictured segment of distal small bowel between 2 of the above described abscess cavities in the lower abdomen just right of midline.  Small right effusion with associated atelectasis unchanged. Improved tiny amount of left pleural fluid.  Cholelithiasis.  Sub cm left renal cortical hypodensity unchanged and likely a cyst but too small to characterize.  Diverticulosis of the colon.  Electronically Signed: By: Elberta Fortis M.D. On: 11/27/2014 12:43   Ct Abdomen Pelvis W Contrast  11/24/2014   CLINICAL DATA:  Diverticulitis.  EXAM: CT ABDOMEN AND PELVIS WITH CONTRAST  TECHNIQUE: Multidetector CT imaging of the abdomen and pelvis was performed using the standard protocol following bolus administration of intravenous contrast.  CONTRAST:  OMNIPAQUE IOHEXOL 300 MG/ML  SOLN  COMPARISON:  CT 11/17/2014  FINDINGS: New small bilateral pleural effusions. Atelectasis in the right lower lobe. Heart is normal size.  Multiple gallstones are again noted within the gallbladder. Liver, spleen, pancreas, adrenals, kidneys are normal.  There is new moderate free air in the abdomen multiple focal gas and fluid collections noted in the lower abdomen and pelvis, the largest on image 58 measuring 5.1 x 3.9 cm. Continued diverticulosis and inflammatory change around the sigmoid colon compatible with active diverticulitis, the presumed source of the abscesses and free air. No evidence of bowel obstruction.  Urinary bladder is decompressed. The bladder wall appears slightly thickened thickened and hyperemic, likely secondarily inflamed from adjacent sigmoid diverticulitis.   IMPRESSION: New moderate pneumoperitoneum and multiple abscesses in the lower abdomen and pelvis, presumably from the ruptured diverticulitis.  Cholelithiasis.  Small bilateral pleural effusions. Right lower lobe compressive atelectasis.  Critical Value/emergent results were called by telephone at the time of interpretation on 11/24/2014 at 10:54 am to [facility], PA-C , who verbally acknowledged these results.   Electronically Signed   By: Charlett Nose M.D.   On: 11/24/2014 11:10    Labs:  CBC:  Recent Labs  11/23/14 0845 11/24/14 0515 11/25/14 0820 11/27/14 0532  WBC 9.1 10.0 11.9* 13.9*  HGB 13.1 12.7* 12.7* 13.1  HCT 38.7* 36.9* 37.5* 38.2*  PLT 326 350 372 460*    COAGS:  Recent Labs  11/27/14 1453  INR 1.24  APTT 43*    BMP:  Recent Labs  11/17/14 1825 11/19/14 0710 11/25/14 0820 11/27/14 0532  NA 137 136 134* 135  K 4.2 4.5 4.0 4.3  CL 103 106 98* 95*  CO2 27 22 25 30   GLUCOSE 157* 121* 102* 114*  BUN 12 14 9 12   CALCIUM 8.9 8.2* 8.0* 8.7*  CREATININE 0.97 0.81 0.75 0.99  GFRNONAA >60 >60 >60 >60  GFRAA >60 >60 >60 >60    LIVER FUNCTION TESTS:  Recent Labs  11/17/14 1825 11/19/14 0710  BILITOT 1.8* 1.4*  AST 25 20  ALT 17 17  ALKPHOS 63 62  PROT 7.6 6.7  ALBUMIN 4.0 3.2*  Assessment and Plan:  diverticular abscess drain x 2 Less pain today Drains intact Will follow   Signed: Kimberly Nieland A 11/28/2014, 10:24 AM   I spent a total of 15 Minutes at the the patient's bedside AND on the patient's hospital floor or unit, greater than 50% of which was counseling/coordinating care for abscess drain x 2

## 2014-11-29 LAB — CBC
HEMATOCRIT: 40.4 % (ref 39.0–52.0)
HEMOGLOBIN: 13.3 g/dL (ref 13.0–17.0)
MCH: 32.6 pg (ref 26.0–34.0)
MCHC: 32.9 g/dL (ref 30.0–36.0)
MCV: 99 fL (ref 78.0–100.0)
Platelets: 608 10*3/uL — ABNORMAL HIGH (ref 150–400)
RBC: 4.08 MIL/uL — ABNORMAL LOW (ref 4.22–5.81)
RDW: 13.4 % (ref 11.5–15.5)
WBC: 7.1 10*3/uL (ref 4.0–10.5)

## 2014-11-29 MED ORDER — LIP MEDEX EX OINT
TOPICAL_OINTMENT | CUTANEOUS | Status: AC
  Administered 2014-11-29: 09:00:00
  Filled 2014-11-29: qty 7

## 2014-11-29 NOTE — Progress Notes (Signed)
Subjective: Patient feels much better with drains in place Abdominal pain significantly better Two BM yesterday  Objective: Vital signs in last 24 hours: Temp:  [97.7 F (36.5 C)-98.5 F (36.9 C)] 98.2 F (36.8 C) (09/18 0517) Pulse Rate:  [86-91] 86 (09/18 0517) Resp:  [16-18] 18 (09/18 0517) BP: (110-133)/(68-84) 133/84 mmHg (09/18 0517) SpO2:  [97 %-98 %] 97 % (09/18 0517) Last BM Date: 11/27/14 (reports having a large bm)  Intake/Output from previous day: 09/17 0701 - 09/18 0700 In: 1690 [I.V.:1630] Out: 778 [Urine:700; Drains:78]  Right drain - 26 ml; midline drain 52 ml  Intake/Output this shift:    General appearance: alert, cooperative and no distress GI: soft; + BS; only tender around drain sites  Lab Results:   Recent Labs  11/28/14 1051 11/29/14 0600  WBC 8.5 7.1  HGB 13.4 13.3  HCT 38.6* 40.4  PLT 519* 608*   BMET  Recent Labs  11/27/14 0532  NA 135  K 4.3  CL 95*  CO2 30  GLUCOSE 114*  BUN 12  CREATININE 0.99  CALCIUM 8.7*   PT/INR  Recent Labs  11/27/14 1453  LABPROT 15.8*  INR 1.24   ABG No results for input(s): PHART, HCO3 in the last 72 hours.  Invalid input(s): PCO2, PO2  Studies/Results: Ct Abdomen Pelvis W Contrast  11/27/2014   ADDENDUM REPORT: 11/27/2014 13:00 ADDENDUM: These results were called by telephone at the time of interpretation on 11/27/2014 at 12:59 pm to Dr. Kristin Bruins nurse, Shon Baton, who verbally acknowledged these results and to indicated them to Dr. Daphine Deutscher was in her presence. Electronically Signed   By: Elberta Fortis M.D.   On: 11/27/2014 13:00  11/27/2014   CLINICAL DATA:  History of diverticulitis and abdominal abscess. No current abdominal complaints  EXAM: CT ABDOMEN AND PELVIS WITH CONTRAST  TECHNIQUE: Multidetector CT imaging of the abdomen and pelvis was performed using the standard protocol following bolus administration of intravenous contrast.  CONTRAST:  OMNIPAQUE IOHEXOL 300  MG/ML  SOLN  COMPARISON:  11/24/2014  FINDINGS: Lung bases demonstrate persistence of a small right pleural effusion with associated atelectasis. Improved tiny amount of left pleural fluid.  Abdominal images continue to demonstrate moderate free peritoneal air. There is diverticulosis of the colon with possible acute diverticulitis involving the sigmoid colon without significant change. Moderate inflammatory change and multiple metastases within the lower abdomen most slightly larger compared to the prior exam. Presumed abscess over the right lower quadrant just below the cecum is stable in size measuring 2.6 x 3.3 cm. Largest presumed abscess increased in size with air-fluid level measures 5 x 6.6 cm (previously 3.9 x 5.1 cm). Two adjacent abscess cavities wrapped around a strictured small bowel loop just right of midline in the lower abdomen are increased in size. Most of these abscess cavities appear to communicate. There is new mild dilatation of multiple small bowel loops proximal to the strictured loop between the 2 abscesses described. There is minimal free fluid in the mesentery.  There is mild a moderate cholelithiasis. The liver, pancreas, spleen and adrenal glands are within normal. Kidneys are normal in size without hydronephrosis or nephrolithiasis. There is a sub cm left renal cortical hypodensity unchanged likely a cyst but too small to characterize. Ureters are within normal. There is calcified plaque over the abdominal aorta. Appendix not well visualized.  Pelvic images demonstrate the bladder, prostate and rectum to be normal. No free pelvic fluid.  There are minimal degenerate changes of  the spine and hips.  IMPRESSION: Persistent mild to moderate free peritoneal air. Multiple mesenteric abscesses in the lower abdomen most with overall increase in size with the largest over the right lower quadrant containing air-fluid level measuring 5 x 6.6 cm (previously 3.9 x 5.1 cm). Most of these abscesses  appear to communicate. Etiology of these abscesses and free air is unclear, possibly sequelae from diverticulitis of the sigmoid colon. There is evolving ileus versus obstruction of the small bowel with possible transition point over a strictured segment of distal small bowel between 2 of the above described abscess cavities in the lower abdomen just right of midline.  Small right effusion with associated atelectasis unchanged. Improved tiny amount of left pleural fluid.  Cholelithiasis.  Sub cm left renal cortical hypodensity unchanged and likely a cyst but too small to characterize.  Diverticulosis of the colon.  Electronically Signed: By: Elberta Fortis M.D. On: 11/27/2014 12:43    Anti-infectives: Anti-infectives    Start     Dose/Rate Route Frequency Ordered Stop   11/24/14 2000  ciprofloxacin (CIPRO) tablet 500 mg     500 mg Oral 2 times daily 11/24/14 1348     11/24/14 1400  metroNIDAZOLE (FLAGYL) tablet 500 mg     500 mg Oral 3 times per day 11/24/14 1348     11/24/14 0000  sulfamethoxazole-trimethoprim (BACTRIM DS,SEPTRA DS) 800-160 MG per tablet  Status:  Discontinued     1 tablet Oral Every 12 hours 11/24/14 0902 11/24/14    11/22/14 1000  sulfamethoxazole-trimethoprim (BACTRIM DS,SEPTRA DS) 800-160 MG per tablet 1 tablet  Status:  Discontinued     1 tablet Oral Every 12 hours 11/22/14 0758 11/24/14 1348   11/20/14 0800  ertapenem (INVANZ) 1 g in sodium chloride 0.9 % 50 mL IVPB  Status:  Discontinued     1 g 100 mL/hr over 30 Minutes Intravenous Every 24 hours 11/20/14 0723 11/22/14 0758   11/18/14 2200  cefTRIAXone (ROCEPHIN) 2 g in dextrose 5 % 50 mL IVPB  Status:  Discontinued     2 g 100 mL/hr over 30 Minutes Intravenous Daily at bedtime 11/18/14 0254 11/20/14 0723   11/18/14 0800  metroNIDAZOLE (FLAGYL) IVPB 500 mg  Status:  Discontinued     500 mg 100 mL/hr over 60 Minutes Intravenous Every 8 hours 11/18/14 0254 11/20/14 0723   11/17/14 2345  ciprofloxacin (CIPRO) IVPB 400 mg      400 mg 200 mL/hr over 60 Minutes Intravenous  Once 11/17/14 2334 11/18/14 0212   11/17/14 2345  metroNIDAZOLE (FLAGYL) IVPB 500 mg     500 mg 100 mL/hr over 60 Minutes Intravenous  Once 11/17/14 2334 11/18/14 0044      Assessment/Plan: Sigmoid diverticulitis with perforation s/p percutaneous drainage of pelvic abscesses x 2 Continue drains/ abx Advance diet Possibly home soon with drains.  Will eventually need colonoscopy, then discussion of possible elective sigmoid resection/ ?cholecystectomy    LOS: 11 days    Carmisha Larusso K. 11/29/2014

## 2014-11-30 ENCOUNTER — Other Ambulatory Visit: Payer: Self-pay | Admitting: Radiology

## 2014-11-30 DIAGNOSIS — K651 Peritoneal abscess: Secondary | ICD-10-CM

## 2014-11-30 LAB — CULTURE, ROUTINE-ABSCESS: SPECIAL REQUESTS: 2

## 2014-11-30 MED ORDER — AMOXICILLIN-POT CLAVULANATE 875-125 MG PO TABS
1.0000 | ORAL_TABLET | Freq: Two times a day (BID) | ORAL | Status: DC
  Administered 2014-11-30: 1 via ORAL
  Filled 2014-11-30 (×2): qty 1

## 2014-11-30 MED ORDER — AMOXICILLIN-POT CLAVULANATE 875-125 MG PO TABS: 1.0000 | ORAL_TABLET | Freq: Two times a day (BID) | ORAL | Status: DC

## 2014-11-30 NOTE — Discharge Summary (Signed)
[facility] Discharge Summary   Patient ID: [name] MRN: [id number] DOB/AGE: [birth year] 59 y.o.  Admit date: 11/17/2014 Discharge date: 11/30/2014  Admitting Diagnosis: Cholelithiasis Sigmoid diverticulosis with diverticulitis Thickening of the terminal ileum  Discharge Diagnosis Patient Active Problem List   Diagnosis Date Noted  . Abdominal abscess   . BPH (benign prostatic hyperplasia) 11/24/2014  . Hematuria 11/24/2014  . Proteinuria 11/24/2014  . Cholelithiasis 11/24/2014  . Diverticulitis 11/18/2014    Consultants Dr. Ronne Binning - Urology Dr. Fredia Sorrow - Interventional Radiology  Imaging: No results found.  Procedures None  Hospital Course:  59 y/o white male who presented to [facility] with lower abdominal pain last evening. After arrival there he developed nausea and vomiting. When seen in the emergency department he apparently had tenderness in the right upper quadrant as well as left lower quadrant. He reports the pain was mostly in the left lower quadrant and then moved to the right upper quadrant. A CT scan showed inflammatory changes around the sigmoid colon and suggested this also around the gallbladder and the terminal ileum. This morning, he reports that most of his discomfort is in the right upper quadrant. He is otherwise healthy without complaints. He had a bout of diverticulitis approximate 16 years ago which was treated with antibody. He had no CT scan at that time. He has never had a colonoscopy. Bowel movements have been normal. The pain is described as sharp and constant  Workup showed sigmoid diverticulitis without abscess and cholelithiasis with mild pericholecystic edema.  A HIDA scan was obtained which was negative for cholecystitis.  His pain remained in his LLQ and suprapubic area.  He was managed on IV Zosyn, but after 2 days on this antibiotic an no improvement he was switched to IV Invanz which he took for 2 days with  improvement.  He was also seen by Urology for hematuria/proteinuria.  No kidney stones were seen on CT scan.  He was recommended to start rapaflo , however this was not on formulary for his insurance, thus we will try flomax 0.4mg  instead.  He is to follow up with Urology for a cystoscopy and urine cytology.  His WBC trended down to normal and pain minimized.  He was switched to oral Bactrim.  Diet was advanced as tolerated.  A repeat CT was obtained on 11/24/14 and showed 5 new intraabdominal abscesses and free abdominal air, patient was clinically improved despite worse CT scan. Radiologist felt only one would be amenable to drainage. Dr. Daphine Deutscher had a long discussion with the patient and he has decided to continue conservative management for now. Another CT was obtained on 11/27/14 which showed enlarging abscesses, thus IR was consulted and 2 percutaneous drains placed.  He has improved significantly since drain placement.  His WBC has normalized.  He will follow up with drain clinic in 1-2 weeks.  IR will arrange his follow up appointment.  Unfortunately the e.coli bacteria was resistent to many antibiotics, but fortunately the bacteria was sensitive to Augmentin.  Dr. Daphine Deutscher has recommended he stay on a full liquid diet at home.    On HD #14, the patient was voiding well, tolerating diet, ambulating well, pain well controlled, vital signs stable, and felt stable for discharge home.  Patient will follow up in our office with Dr. Daphine Deutscher in 1-2 weeks and knows to call with questions or concerns.  He will call to confirm appointment date/time.  HH nursing was set up to help with dressing changes  and drain management.        Medication List    TAKE these medications        amoxicillin-clavulanate 875-125 MG per tablet  Commonly known as:  AUGMENTIN  Take 1 tablet by mouth every 12 (twelve) hours.     HYDROcodone-acetaminophen 5-325 MG per tablet  Commonly known as:  NORCO/VICODIN  Take 1-2  tablets by mouth every 6 (six) hours as needed for moderate pain or severe pain.     ibuprofen 200 MG tablet  Commonly known as:  ADVIL,MOTRIN  Take 600 mg by mouth every 6 (six) hours as needed (for pain).     ranitidine 150 MG tablet  Commonly known as:  ZANTAC  Take 150 mg by mouth 2 (two) times daily.     tamsulosin 0.4 MG Caps capsule  Commonly known as:  FLOMAX  Take 1 capsule (0.4 mg total) by mouth daily after breakfast.         Follow-up Information    Follow up with MCKENZIE, PATRICK L, MD. Call in 1 week.   Specialty:  Urology   Why:  For post-hospital follow up regarding your prostate trouble, bloody urine, within 1-2 weeks.   Contact information:   [address] [place] [address] [phone]       Follow up with Luretha Murphy B, MD. Call in 2 weeks.   Specialty:  General Surgery   Why:  For post-hospital follow up.  Call to confirm your appointment date/time.  You will need a colonoscopy in 6-8 weeks from now as well.     Contact information:   [address] [place] [address] [phone]       Follow up with [facility].   Why:  drain care   Contact information:   [address] [place] [address] [phone]       Signed: Bradd Canary [facility] [phone]  11/30/2014, 10:50 AM

## 2014-11-30 NOTE — Progress Notes (Signed)
Patient ID: [name], male   DOB: 05-18-1955, 59 y.o.   MRN: [id number]    Referring Physician(s): [facility]  Chief Complaint: Pelvic abscesses   Subjective: Patient doing fairly well; has some mild abdominal tenderness; denies nausea/vomiting   Allergies: Food  Medications: Prior to Admission medications   Medication Sig Start Date End Date Taking? Authorizing Provider  ibuprofen (ADVIL,MOTRIN) 200 MG tablet Take 600 mg by mouth every 6 (six) hours as needed (for pain).   Yes Historical Provider, MD  ranitidine (ZANTAC) 150 MG tablet Take 150 mg by mouth 2 (two) times daily.   Yes Historical Provider, MD  amoxicillin-clavulanate (AUGMENTIN) 875-125 MG per tablet Take 1 tablet by mouth every 12 (twelve) hours. 11/30/14   Nonie Hoyer, PA-C  HYDROcodone-acetaminophen (NORCO/VICODIN) 5-325 MG per tablet Take 1-2 tablets by mouth every 6 (six) hours as needed for moderate pain or severe pain. 11/24/14   Nonie Hoyer, PA-C  tamsulosin (FLOMAX) 0.4 MG CAPS capsule Take 1 capsule (0.4 mg total) by mouth daily after breakfast. 11/24/14   Nonie Hoyer, PA-C     Vital Signs: BP 120/77 mmHg  Pulse 76  Temp(Src) 98.1 F (36.7 C) (Oral)  Resp 18  Ht  (1.727 m)  Wt 170 lb (77.111 kg)  BMI 25.85 kg/m2  SpO2 99%  Physical Exam patient awake, alert. Pelvic drains intact, insertion sites okay, mild tenderness to palpation, output 30-40 mL each blood-tinged/serous fluid  Imaging: Ct Abdomen Pelvis W Contrast  11/27/2014   ADDENDUM REPORT: 11/27/2014 13:00 ADDENDUM: These results were called by telephone at the time of interpretation on 11/27/2014 at 12:59 pm to Dr. Kristin Bruins nurse, Shon Baton, who verbally acknowledged these results and to indicated them to Dr. Daphine Deutscher was in her presence. Electronically Signed   By: Elberta Fortis M.D.   On: 11/27/2014 13:00  11/27/2014   CLINICAL DATA:  History of diverticulitis and abdominal abscess. No current abdominal complaints  EXAM: CT  ABDOMEN AND PELVIS WITH CONTRAST  TECHNIQUE: Multidetector CT imaging of the abdomen and pelvis was performed using the standard protocol following bolus administration of intravenous contrast.  CONTRAST:  OMNIPAQUE IOHEXOL 300 MG/ML  SOLN  COMPARISON:  11/24/2014  FINDINGS: Lung bases demonstrate persistence of a small right pleural effusion with associated atelectasis. Improved tiny amount of left pleural fluid.  Abdominal images continue to demonstrate moderate free peritoneal air. There is diverticulosis of the colon with possible acute diverticulitis involving the sigmoid colon without significant change. Moderate inflammatory change and multiple metastases within the lower abdomen most slightly larger compared to the prior exam. Presumed abscess over the right lower quadrant just below the cecum is stable in size measuring 2.6 x 3.3 cm. Largest presumed abscess increased in size with air-fluid level measures 5 x 6.6 cm (previously 3.9 x 5.1 cm). Two adjacent abscess cavities wrapped around a strictured small bowel loop just right of midline in the lower abdomen are increased in size. Most of these abscess cavities appear to communicate. There is new mild dilatation of multiple small bowel loops proximal to the strictured loop between the 2 abscesses described. There is minimal free fluid in the mesentery.  There is mild a moderate cholelithiasis. The liver, pancreas, spleen and adrenal glands are within normal. Kidneys are normal in size without hydronephrosis or nephrolithiasis. There is a sub cm left renal cortical hypodensity unchanged likely a cyst but too small to characterize. Ureters are within normal. There is calcified plaque over the abdominal  aorta. Appendix not well visualized.  Pelvic images demonstrate the bladder, prostate and rectum to be normal. No free pelvic fluid.  There are minimal degenerate changes of the spine and hips.  IMPRESSION: Persistent mild to moderate free peritoneal air.  Multiple mesenteric abscesses in the lower abdomen most with overall increase in size with the largest over the right lower quadrant containing air-fluid level measuring 5 x 6.6 cm (previously 3.9 x 5.1 cm). Most of these abscesses appear to communicate. Etiology of these abscesses and free air is unclear, possibly sequelae from diverticulitis of the sigmoid colon. There is evolving ileus versus obstruction of the small bowel with possible transition point over a strictured segment of distal small bowel between 2 of the above described abscess cavities in the lower abdomen just right of midline.  Small right effusion with associated atelectasis unchanged. Improved tiny amount of left pleural fluid.  Cholelithiasis.  Sub cm left renal cortical hypodensity unchanged and likely a cyst but too small to characterize.  Diverticulosis of the colon.  Electronically Signed: By: Elberta Fortis M.D. On: 11/27/2014 12:43    Labs:  CBC:  Recent Labs  11/25/14 0820 11/27/14 0532 11/28/14 1051 11/29/14 0600  WBC 11.9* 13.9* 8.5 7.1  HGB 12.7* 13.1 13.4 13.3  HCT 37.5* 38.2* 38.6* 40.4  PLT 372 460* 519* 608*    COAGS:  Recent Labs  11/27/14 1453  INR 1.24  APTT 43*    BMP:  Recent Labs  11/17/14 1825 11/19/14 0710 11/25/14 0820 11/27/14 0532  NA 137 136 134* 135  K 4.2 4.5 4.0 4.3  CL 103 106 98* 95*  CO2 GLUCOSE 157* 121* 102* 114*  BUN CALCIUM 8.9 8.2* 8.0* 8.7*  CREATININE 0.97 0.81 0.75 0.99  GFRNONAA >60 >60 >60 >60  GFRAA >60 >60 >60 >60    LIVER FUNCTION TESTS:  Recent Labs  11/17/14 1825 11/19/14 0710  BILITOT 1.8* 1.4*  AST 25 20  ALT 17 17  ALKPHOS 63 62  PROT 7.6 6.7  ALBUMIN 4.0 3.2*    Assessment and Plan: Patient status post drainage of pelvic/peritoneal abscesses 2 on 9/16; afebrile; WBC normal; fluid cultures revealed Escherichia coli; continue current treatment. If pt discharged home with drains  recommend follow-up in drain  clinic next week. Drains will need to be flushed once daily, outputs recorded and dressing changes as needed.  Signed: D. Jeananne Rama 11/30/2014, 2:01 PM   I spent a total of 15 minutes at the the patient's bedside AND on the patient's hospital floor or unit, greater than 50% of which was counseling/coordinating care for pelvic abscess drainage 2

## 2014-11-30 NOTE — Care Management Note (Signed)
Case Management Note  Patient Details  Name: Cavion Faiola MRN: [id number] Date of Birth: [birth year]  Subjective/Objective:      Admitted with diverticulitis, abscess and perforation.               Action/Plan: Discharge planning, spoke with patient at bedside. Discussed need for [facility] services for perc drain care. Patient had no preference. Contacted [facility] for referral.   Expected Discharge Date:  11/19/14               Expected Discharge Plan:  Home w Home Health Services  In-House Referral:  NA  Discharge planning Services  CM Consult  Post Acute Care Choice:  Home Health Choice offered to:  Patient  DME Arranged:  N/A DME Agency:  NA  HH Arranged:  RN, Disease Management HH Agency:  [facility]  Status of Service:  Completed, signed off  Medicare Important Message Given:    Date Medicare IM Given:    Medicare IM give by:    Date Additional Medicare IM Given:    Additional Medicare Important Message give by:     If discussed at Long Length of Stay Meetings, dates discussed:    Additional Comments:  Alexis Goodell, RN 11/30/2014, 10:28 AM

## 2014-12-01 LAB — CULTURE, ROUTINE-ABSCESS: SPECIAL REQUESTS: 1

## 2014-12-02 LAB — ANAEROBIC CULTURE
SPECIAL REQUESTS: 2
Special Requests: 1

## 2014-12-07 ENCOUNTER — Other Ambulatory Visit: Payer: Self-pay | Admitting: Surgery

## 2014-12-07 ENCOUNTER — Other Ambulatory Visit: Payer: Self-pay | Admitting: Interventional Radiology

## 2014-12-07 ENCOUNTER — Other Ambulatory Visit: Payer: Self-pay | Admitting: Radiology

## 2014-12-07 DIAGNOSIS — K651 Peritoneal abscess: Secondary | ICD-10-CM

## 2014-12-10 ENCOUNTER — Ambulatory Visit
Admission: RE | Admit: 2014-12-10 | Discharge: 2014-12-10 | Disposition: A | Discharge status: Home / Self Care | Payer: BLUE CROSS/BLUE SHIELD | Source: Ambulatory Visit | Attending: Interventional Radiology | Admitting: Interventional Radiology

## 2014-12-10 ENCOUNTER — Ambulatory Visit
Admission: RE | Admit: 2014-12-10 | Discharge: 2014-12-10 | Disposition: A | Discharge status: Home / Self Care | Payer: BLUE CROSS/BLUE SHIELD | Source: Ambulatory Visit | Attending: Surgery | Admitting: Surgery

## 2014-12-10 DIAGNOSIS — K651 Peritoneal abscess: Secondary | ICD-10-CM

## 2014-12-10 MED ORDER — IOPAMIDOL (ISOVUE-300) INJECTION 61%
100.0000 mL | Freq: Once | INTRAVENOUS | Status: AC | PRN
  Administered 2014-12-10: 100 mL via INTRAVENOUS

## 2014-12-14 ENCOUNTER — Other Ambulatory Visit: Payer: Self-pay | Admitting: Interventional Radiology

## 2014-12-14 DIAGNOSIS — K651 Peritoneal abscess: Secondary | ICD-10-CM

## 2014-12-15 ENCOUNTER — Ambulatory Visit
Admission: RE | Admit: 2014-12-15 | Discharge: 2014-12-15 | Disposition: A | Discharge status: Home / Self Care | Payer: BLUE CROSS/BLUE SHIELD | Source: Ambulatory Visit | Attending: Interventional Radiology | Admitting: Interventional Radiology

## 2014-12-15 ENCOUNTER — Other Ambulatory Visit: Payer: Self-pay | Admitting: Interventional Radiology

## 2014-12-15 DIAGNOSIS — K651 Peritoneal abscess: Secondary | ICD-10-CM

## 2014-12-16 DIAGNOSIS — K651 Peritoneal abscess: Secondary | ICD-10-CM | POA: Insufficient documentation

## 2014-12-16 NOTE — Progress Notes (Signed)
Chief Complaint: Patient was seen in consultation today for abscess drain issues at the request of AlejandroJaime  Referring Physician(s): AlejandroJaime  History of Present Illness: [name] is a 59 y.o. male with a history of sigmoid diverticulitis complicated by perforation and multi-focal abscesses.  Two perc abscess drains were placed on 9/16.  Drains were then evaluated with CT and tube injection on 9/29.  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  Drains were also re-fixed in place with new adhesive fixation devices.  Since that time, patient has noticed that flushes reflux back along the tube onto the skin and the more midline drain has begun draining bloody purulent fluid. He presents to clinic today for assessment.   He denies pain, nausea, vomiting or fever.  His stools are normal.  He continues to take his augmentin.    Past Medical History  Diagnosis Date  . Diverticulitis   . GERD (gastroesophageal reflux disease)     No past surgical history on file.  Allergies: Food  Medications: Prior to Admission medications   Medication Sig Start Date End Date Taking? Authorizing Provider  amoxicillin-clavulanate (AUGMENTIN) 875-125 MG per tablet Take 1 tablet by mouth every 12 (twelve) hours. 11/30/14   Nonie Hoyer, PA-C  HYDROcodone-acetaminophen (NORCO/VICODIN) 5-325 MG per tablet Take 1-2 tablets by mouth every 6 (six) hours as needed for moderate pain or severe pain. 11/24/14   Nonie Hoyer, PA-C  ibuprofen (ADVIL,MOTRIN) 200 MG tablet Take 600 mg by mouth every 6 (six) hours as needed (for pain).    Historical Provider, MD  ranitidine (ZANTAC) 150 MG tablet Take 150 mg by mouth 2 (two) times daily.    Historical Provider, MD  tamsulosin (FLOMAX) 0.4 MG CAPS capsule Take 1 capsule (0.4 mg total) by mouth daily after breakfast. 11/24/14   Nonie Hoyer, PA-C     No family history on  file.  Social History   Social History  . Marital Status: Single    Spouse Name: N/A  . Number of Children: N/A  . Years of Education: N/A   Social History Main Topics  . Smoking status: Former Games developer  . Smokeless tobacco: Not on file  . Alcohol Use: No  . Drug Use: Not on file  . Sexual Activity: Not on file   Other Topics Concern  . Not on file   Social History Narrative     Review of Systems: A 12 point ROS discussed and pertinent positives are indicated in the HPI above.  All other systems are negative.  Review of Systems  Vital Signs: BP 102/68 mmHg  Pulse 79  Temp(Src) 98 F (36.7 C) (Oral)  SpO2 99%  Physical Exam  Constitutional: He appears well-developed and well-nourished. No distress.  HENT:  Head: Normocephalic and atraumatic.  Eyes: No scleral icterus.  Abdominal: Soft. He exhibits no distension. There is no tenderness.    Drain sites look good.  No cellulitis or drainage around insertion sites.   Vitals reviewed.   Imaging: Nm Hepatobiliary Including Gb  11/18/2014   CLINICAL DATA:  Cholecystitis  EXAM: NUCLEAR MEDICINE HEPATOBILIARY IMAGING  TECHNIQUE: Sequential images of the abdomen were obtained out to 60 minutes following intravenous administration of radiopharmaceutical.  RADIOPHARMACEUTICALS:  5.1 mCi Tc-65m  Choletec IV  COMPARISON:  Ultrasound 11/18/2014  FINDINGS: There is normal uptake of the tracer by the liver. CBD visualized at 10 minutes. Gallbladder visualized  at 20 minutes. Bowel activity noted at 15 minutes.  IMPRESSION: No cystic duct obstruction.  Gallbladder visualized at 20 minutes.   Electronically Signed   By: Natasha Mead M.D.   On: 11/18/2014 13:50   Ct Abdomen Pelvis W Contrast  12/10/2014   CLINICAL DATA:  59 year old male with a history of diverticulitis and subsequent abscess formation secondary to perforation and peritoneal contamination.  Initial CT image performed 11/17/2014 with follow-up 11/24/2014 demonstrating  perforation. Two separate percutaneous drains placed 11/27/2014 into a separate abscess collections.  Since this time, the patient reports only scant fluid drainage from each of the drains. He denies fevers, rigors, chills.  EXAM: CT ABDOMEN AND PELVIS WITH CONTRAST  TECHNIQUE: Multidetector CT imaging of the abdomen and pelvis was performed using the standard protocol following bolus administration of intravenous contrast.  CONTRAST:  ISOVUE-300 IOPAMIDOL (ISOVUE-300) INJECTION 61%  COMPARISON:  CT 11/27/2014, 11/24/2014, 11/17/2014  FINDINGS: Lower chest:  Unremarkable appearance of the soft tissues of the chest wall.  Heart size within normal limits.  No pericardial fluid/thickening.  No lower mediastinal adenopathy.  Unremarkable appearance of the distal esophagus.  No hiatal hernia.  No confluent airspace disease, pleural fluid, or pneumothorax within visualized lung.  Abdomen/pelvis:  Unremarkable appearance of liver and spleen.  Unremarkable appearance of bilateral adrenal glands.  No peripancreatic or pericholecystic fluid or inflammatory changes.  Radiopaque gallstones. No pericholecystic fluid or inflammatory changes.  No intrahepatic or extrahepatic biliary ductal dilatation.  Interval resolution of intraperitoneal gas.  Pigtail surgical drains within the lower abdomen again evident with 1 to the right of midline and 1 in the midline adjacent to sigmoid colon.  No significant fluid collection adjacent to either drain.  There is a small gas collection anterior to both the sigmoid colon and the drain next to the sigmoid colon with small amount of inflammatory changes. The gas collection measures approximately 16 mm.  Diverticular disease again evident throughout the sigmoid colon. Appendix is not visualized, however, no inflammatory changes are present adjacent to the cecum to indicate an appendicitis.  Right Kidney/Ureter:  No hydronephrosis. No nephrolithiasis. No perinephric stranding. Unremarkable  course of the right ureter.  Left Kidney/Ureter:  No hydronephrosis. No nephrolithiasis. No perinephric stranding.  Unremarkable course of the left ureter.  Unremarkable appearance of the urinary bladder. No intraluminal air.  Calcifications of the prostate.  Scattered vascular calcifications.  No aneurysm.  Musculoskeletal:  No displaced fracture identified.  No significant degenerative changes of the spine.  IMPRESSION: Interval resolution of intraperitoneal air, and a significant decrease in pericolonic inflammation adjacent to the sigmoid colon, compatible with resolved diverticulitis.  Pigtail drainage catheters in the right lower abdomen and midline lower abdomen are unchanged in position from the placement 11/27/2014, with no significant residual fluid. There is a tiny gas collection between the sigmoid colon and the midline catheter, which may be air related to drain flush. No evidence of persistent abscess.  Cholelithiasis.  Atherosclerosis.  Signed,  Yvone Neu. Loreta Ave, DO  [facility]  [facility]   Electronically Signed   By: Gilmer Mor D.O.   On: 12/10/2014 14:08   Ct Abdomen Pelvis W Contrast  11/27/2014   ADDENDUM REPORT: 11/27/2014 13:00 ADDENDUM: These results were called by telephone at the time of interpretation on 11/27/2014 at 12:59 pm to Dr. Kristin Bruins nurse, Shon Baton, who verbally acknowledged these results and to indicated them to Dr. Daphine Deutscher was in her presence. Electronically Signed   By:  Elberta Fortis M.D.   On: 11/27/2014 13:00  11/27/2014   CLINICAL DATA:  History of diverticulitis and abdominal abscess. No current abdominal complaints  EXAM: CT ABDOMEN AND PELVIS WITH CONTRAST  TECHNIQUE: Multidetector CT imaging of the abdomen and pelvis was performed using the standard protocol following bolus administration of intravenous contrast.  CONTRAST:  OMNIPAQUE IOHEXOL 300 MG/ML  SOLN  COMPARISON:  11/24/2014  FINDINGS: Lung  bases demonstrate persistence of a small right pleural effusion with associated atelectasis. Improved tiny amount of left pleural fluid.  Abdominal images continue to demonstrate moderate free peritoneal air. There is diverticulosis of the colon with possible acute diverticulitis involving the sigmoid colon without significant change. Moderate inflammatory change and multiple metastases within the lower abdomen most slightly larger compared to the prior exam. Presumed abscess over the right lower quadrant just below the cecum is stable in size measuring 2.6 x 3.3 cm. Largest presumed abscess increased in size with air-fluid level measures 5 x 6.6 cm (previously 3.9 x 5.1 cm). Two adjacent abscess cavities wrapped around a strictured small bowel loop just right of midline in the lower abdomen are increased in size. Most of these abscess cavities appear to communicate. There is new mild dilatation of multiple small bowel loops proximal to the strictured loop between the 2 abscesses described. There is minimal free fluid in the mesentery.  There is mild a moderate cholelithiasis. The liver, pancreas, spleen and adrenal glands are within normal. Kidneys are normal in size without hydronephrosis or nephrolithiasis. There is a sub cm left renal cortical hypodensity unchanged likely a cyst but too small to characterize. Ureters are within normal. There is calcified plaque over the abdominal aorta. Appendix not well visualized.  Pelvic images demonstrate the bladder, prostate and rectum to be normal. No free pelvic fluid.  There are minimal degenerate changes of the spine and hips.  IMPRESSION: Persistent mild to moderate free peritoneal air. Multiple mesenteric abscesses in the lower abdomen most with overall increase in size with the largest over the right lower quadrant containing air-fluid level measuring 5 x 6.6 cm (previously 3.9 x 5.1 cm). Most of these abscesses appear to communicate. Etiology of these abscesses and  free air is unclear, possibly sequelae from diverticulitis of the sigmoid colon. There is evolving ileus versus obstruction of the small bowel with possible transition point over a strictured segment of distal small bowel between 2 of the above described abscess cavities in the lower abdomen just right of midline.  Small right effusion with associated atelectasis unchanged. Improved tiny amount of left pleural fluid.  Cholelithiasis.  Sub cm left renal cortical hypodensity unchanged and likely a cyst but too small to characterize.  Diverticulosis of the colon.  Electronically Signed: By: Elberta Fortis M.D. On: 11/27/2014 12:43   Ct Abdomen Pelvis W Contrast  11/24/2014   CLINICAL DATA:  Diverticulitis.  EXAM: CT ABDOMEN AND PELVIS WITH CONTRAST  TECHNIQUE: Multidetector CT imaging of the abdomen and pelvis was performed using the standard protocol following bolus administration of intravenous contrast.  CONTRAST:  OMNIPAQUE IOHEXOL 300 MG/ML  SOLN  COMPARISON:  CT 11/17/2014  FINDINGS: New small bilateral pleural effusions. Atelectasis in the right lower lobe. Heart is normal size.  Multiple gallstones are again noted within the gallbladder. Liver, spleen, pancreas, adrenals, kidneys are normal.  There is new moderate free air in the abdomen multiple focal gas and fluid collections noted in the lower abdomen and pelvis, the largest on image 58  measuring 5.1 x 3.9 cm. Continued diverticulosis and inflammatory change around the sigmoid colon compatible with active diverticulitis, the presumed source of the abscesses and free air. No evidence of bowel obstruction.  Urinary bladder is decompressed. The bladder wall appears slightly thickened thickened and hyperemic, likely secondarily inflamed from adjacent sigmoid diverticulitis.  IMPRESSION: New moderate pneumoperitoneum and multiple abscesses in the lower abdomen and pelvis, presumably from the ruptured diverticulitis.  Cholelithiasis.  Small bilateral  pleural effusions. Right lower lobe compressive atelectasis.  Critical Value/emergent results were called by telephone at the time of interpretation on 11/24/2014 at 10:54 am to [facility], PA-C , who verbally acknowledged these results.   Electronically Signed   By: Charlett Nose M.D.   On: 11/24/2014 11:10   Ct Abdomen Pelvis W Contrast  11/17/2014   CLINICAL DATA:  Lambert Mody lower abdominal pain mostly to the left beginning yesterday and continuing today. Nausea with vomiting. Fever and chills.  EXAM: CT ABDOMEN AND PELVIS WITH CONTRAST  TECHNIQUE: Multidetector CT imaging of the abdomen and pelvis was performed using the standard protocol following bolus administration of intravenous contrast.  CONTRAST:  OMNIPAQUE IOHEXOL 300 MG/ML SOLN, 50mL OMNIPAQUE IOHEXOL 300 MG/ML SOLN  COMPARISON:  None.  FINDINGS: Dependent type atelectasis in the lung bases. Residual contrast material in the esophagus may indicate reflux or dysmotility.  Cholelithiasis with several stones in the gallbladder. Suggestion of mild pericholecystic edema. Changes may indicate cholecystitis. No bile duct dilatation. The liver, spleen, pancreas, adrenal glands, kidneys, abdominal aorta, inferior vena cava, and retroperitoneal lymph nodes are unremarkable. Stomach, small bowel, and colon are not abnormally distended. Contrast material flows through to the colon without evidence of bowel obstruction. Suggestion of mild wall thickening in the terminal ileum could represent reactive inflammation or enteritis. No free air or free fluid in the abdomen.  Pelvis: The appendix is normal. Diverticulosis of sigmoid colon. Inflammatory infiltration in the fat around the sigmoid colon consistent with sigmoid diverticulitis. No abscess. Bladder is decompressed. Prostate gland is enlarged with calcifications. Small amount of free fluid in the pelvis is likely reactive. No destructive bone lesions.  IMPRESSION: 1. Cholelithiasis with mild pericholecystic  edema. Changes are nonspecific but could indicate cholecystitis. 2. Sigmoid diverticulosis with diverticulitis.  No abscess. 3. Thickening of the wall of the terminal ileum may indicate reactive inflammation or enteritis. No obstruction.   Electronically Signed   By: Burman Nieves M.D.   On: 11/17/2014 23:30   Dg Sinus/fist Tube Chk-non Gi  12/15/2014   CLINICAL DATA:  59 year old male with history of diverticulitis complicated by multifocal abscesses. He has 2 percutaneous drainage catheters in place. The slightly more lateral drain in the right lower quadrant over flows when flushed. Recent assessment performed last week on 12/10/2014 demonstrates a persistent fistulous connection with the midline drainage catheter and the sigmoid colon.  EXAM: ABSCESS INJECTION  CONTRAST:  5 mL Omnipaque 350  FLUOROSCOPY TIME:  If the device does not provide the exposure index:  Fluoroscopy Time (in minutes and seconds):  12 seconds  Number of Acquired Images:  0  COMPARISON:  Prior contrast injection 12/10/2014  FINDINGS: Contrast was injected through the right lower quadrant catheter. There is no evidence of fistulous communication with the colon. The abscess cavity is completely collapsed. Injected contrast material refluxes along the surface of the tube to the skin surface.  IMPRESSION: 1. No evidence of residual abscess cavity or fistulous connection involving the right lower quadrant drainage catheter. 2. This catheter was  removed.  PLAN: 1. Flushing the midline drainage catheter. 2. Continue to record output. 3. Follow-up in drain clinic in 1-2 weeks with drain injection to assess for persistent fistula.  Signed,  Sterling Big, MD  [facility]  [facility]   Electronically Signed   By: Malachy Moan M.D.   On: 12/15/2014 16:46   Dg Sinus/fist Tube Chk-non Gi  12/10/2014   CLINICAL DATA:  59 year old male with a history of diverticular abscess.  EXAM:  ABSCESS INJECTION  CONTRAST:  10 cc  FLUOROSCOPY TIME:  Fluoroscopy Time (in minutes and seconds): 24 seconds  COMPARISON:  CT 12/10/2014, 11/27/2014  FINDINGS: Scout image demonstrates 2 pigtail catheter within the low abdomen.  Retained contrast within the urinary bladder, status post contrast-enhanced CT.  Injection of the pigtail catheter to the patient's right demonstrates no significant abscess. There is a small amount of contrast tracking to the adjacent drain within the peritoneum.  Drain to the patient's left in the midline was injected with no significant abscess cavity. 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  IMPRESSION: Status post drain injection of 2 pigtail catheter drains in the low abdomen demonstrating no persisting abscess cavity adjacent to either drain.  There is a fistulous connection of the left-sided drain in the midline with the sigmoid colon.  Signed,  Yvone Neu. Loreta Ave, DO  [facility]  [facility]  PLAN: The patient reports a surgical appointment on the following day September 30th.  May return as needed to evaluate these drains.   Electronically Signed   By: Gilmer Mor D.O.   On: 12/10/2014 14:15   Ct Image Guided Drainage By Percutaneous Catheter  12/01/2014   INDICATION: History of diverticular abscess. Request made for placement of percutaneous abdominal abscess drainage catheter(s).  EXAM: CT IMAGE GUIDED DRAINAGE BY PERCUTANEOUS CATHETER x2  COMPARISON:  CT abdomen pelvis - 11/27/2014; 11/24/2014; 11/17/2014  MEDICATIONS: The patient is currently admitted to the hospital and receiving intravenous antibiotics. The antibiotics were administered within an appropriate time frame prior to the initiation of the procedure.  ANESTHESIA/SEDATION: Fentanyl 100 mcg IV; Versed 6 mg IV  Total Moderate Sedation time  39 minutes  CONTRAST:  None  COMPLICATIONS: None immediate  PROCEDURE: Informed written consent was obtained  from the patient after a discussion of the risks, benefits and alternatives to treatment. The patient was placed supine on the CT gantry and a pre procedural CT was performed re-demonstrating the known dominant abscess/fluid collection within the right lower abdominal quadrant with air and fluid containing component measuring approximately 5.7 x 4.5 cm (image 12, series 2) as well as an additional less well-defined approximately 3.3 x 4.7 cm fluid collection within the adjacent midline of the lower abdomen/ upper pelvis (image 20, series 2). The procedure was planned. The skin overlying the midline of the lower abdomen / upper pelvis was prepped and draped in the usual sterile fashion. A time-out was performed prior to initiation of the procedure.  Attention was first paid towards the dominant approximately 5.7 cm air and fluid containing abscess within the right lower abdominal quadrant. The overlying soft tissues were anesthetized with 1% lidocaine with epinephrine. Appropriate trajectory was planned with the use of a 22 gauge spinal needle. An 18 gauge trocar needle was advanced into the abscess/fluid collection and a short Amplatz super stiff wire was coiled within the collection. Appropriate positioning was confirmed with a limited CT scan. The tract was  serially dilated allowing placement of a 12 [place] all-purpose drainage catheter. Appropriate positioning was confirmed with a limited postprocedural CT scan. A small amount of purulent fluid was aspirated and a small sample was sent to the laboratory for analysis.  Attention was now paid towards the less well-defined approximately 4.7 cm fluid collection within the midline of the lower abdomen/upper pelvis. The overlying soft tissues were anesthetized with 1% lidocaine with epinephrine. Appropriate trajectory was planned with the use of a 22 gauge spinal needle. An 18 gauge trocar needle was advanced into the abscess/fluid collection and a short Amplatz super  stiff wire was coiled within the collection. Appropriate positioning was confirmed with a limited CT scan. The tract was serially dilated allowing placement of a 12 [place] all-purpose drainage catheter. Appropriate positioning was confirmed with a limited postprocedural CT scan. A small amount of purulent fluid was aspirated and a small sample was sent to the laboratory for analysis.  Both percutaneous drainage catheters were connected to drainage bags and sutured in place. A dressing was placed. The patient tolerated the procedure well without immediate post procedural complication.  IMPRESSION: 1. Successful CT guided placement of a 12 [place] all purpose drain catheter into the dominant approximately 5.7 cm air and fluid containing abscess within the right lower abdominal quadrant. A small sample of purulent fluid from this dominant collection was sent to the laboratory for analysis. 2. Successful CT-guided placement of a 12 [place] all-purpose drainage catheter into an additional less well-defined fluid collection within the lower abdomen/upper pelvis. A small sample of purulent fluid aspirated from this abscess was sent to the laboratory for analysis. This procedure was performed by Dr. Fredia Sorrow on 11/27/2014.   Electronically Signed   By: Simonne Come M.D.   On: 12/01/2014 16:44   US Abdomen Limited Ruq  11/18/2014   CLINICAL DATA:  Left lower quadrant pain and right lower quadrant pain for 12 hours.  EXAM: US ABDOMEN LIMITED - RIGHT UPPER QUADRANT  COMPARISON:  CT abdomen and pelvis 11/17/2014  FINDINGS: Gallbladder:  Cholelithiasis with multiple stones in the gallbladder, largest measuring about 1.5 cm diameter. Diffuse gallbladder wall thickening up to 4 mm with mild edema. Murphy's sign is negative. However, patient has had pain medications, limiting the sensitivity of Murphy's sign.  Common bile duct:  Diameter: 5 mm, normal  Liver:  No focal lesion identified. Within normal limits in parenchymal  echogenicity.  IMPRESSION: Cholelithiasis with edematous gallbladder wall thickening, likely indicating acute cholecystitis.   Electronically Signed   By: Burman Nieves M.D.   On: 11/18/2014 00:34    Labs:  CBC:  Recent Labs  11/25/14 0820 11/27/14 0532 11/28/14 1051 11/29/14 0600  WBC 11.9* 13.9* 8.5 7.1  HGB 12.7* 13.1 13.4 13.3  HCT 37.5* 38.2* 38.6* 40.4  PLT 372 460* 519* 608*    COAGS:  Recent Labs  11/27/14 1453  INR 1.24  APTT 43*    BMP:  Recent Labs  11/17/14 1825 11/19/14 0710 11/25/14 0820 11/27/14 0532  NA 137 136 134* 135  K 4.2 4.5 4.0 4.3  CL 103 106 98* 95*  CO2 27 22 25 30   GLUCOSE 157* 121* 102* 114*  BUN 12 14 9 12   CALCIUM 8.9 8.2* 8.0* 8.7*  CREATININE 0.97 0.81 0.75 0.99  GFRNONAA >60 >60 >60 >60  GFRAA >60 >60 >60 >60    LIVER FUNCTION TESTS:  Recent Labs  11/17/14 1825 11/19/14 0710  BILITOT 1.8* 1.4*  AST 25 20  ALT  17 17  ALKPHOS 63 62  PROT 7.6 6.7  ALBUMIN 4.0 3.2*    TUMOR MARKERS: No results for input(s): AFPTM, CEA, CA199, CHROMGRNA in the last 8760 hours.  Assessment and Plan:  The RLQ tube is no draining and there is no evidence of residual abscess or fistula (related to this tube) on today's evaluation. Thsi tube was removed.  The midline tube which was shown to have a connection to the sigmoid colon appears to be working normally.  The drainage indicates the tube si serving ist intended purpose.   - Stop saline flushes  - Return to drain clinic for drain injection in 1-2 weeks.  If fistula resolved, remove drain.  Thank you for this interesting consult.  I greatly enjoyed meeting Tenzin Edelman and look forward to participating in their care.  A copy of this report was sent to the requesting provider on this date.  Signed: Ambrosia Wisnewski 12/16/2014, 11:03 AM   I spent a total of  15 Minutes in face to face in clinical consultation, greater than 50% of which was counseling/coordinating care for  diverticular abscess

## 2014-12-23 ENCOUNTER — Ambulatory Visit
Admission: RE | Admit: 2014-12-23 | Discharge: 2014-12-23 | Disposition: A | Discharge status: Home / Self Care | Payer: BLUE CROSS/BLUE SHIELD | Source: Ambulatory Visit | Attending: Interventional Radiology | Admitting: Interventional Radiology

## 2014-12-23 ENCOUNTER — Other Ambulatory Visit (HOSPITAL_COMMUNITY): Payer: Self-pay | Admitting: Interventional Radiology

## 2014-12-23 ENCOUNTER — Other Ambulatory Visit: Payer: Self-pay | Admitting: Interventional Radiology

## 2014-12-23 DIAGNOSIS — K651 Peritoneal abscess: Secondary | ICD-10-CM

## 2014-12-23 NOTE — Progress Notes (Signed)
Patient ID: [name], male   DOB: 1956/02/12, 59 y.o.   MRN: [id number]       Chief Complaint: Evaluation and management of remaining percutaneous drainage catheter.  Referring Physician(s): Daphine Deutscher  History of Present Illness: [name] is a 59 y.o. male with history of sigmoid diverticulitis, located by perforation and multi focal abscess formation. Patient underwent placement of 2 percutaneous drainage catheters on 11/27/2014. The patient's cranially located percutaneous drainage catheter was removed on 12/15/2014 however the patient return to the interventional radiology clinic for evaluation remaining midline percutaneous drainage catheter. The patient is unaccompanied and serves as his own historian.  The patient has completed his course of antibiotics. The patient reports only very minimal output from the remaining percutaneous drainage catheter of approximate 5 mL of serous-appearing fluid per day. The patient tends to flush his per continues drainage catheter. Patient denies fever or chills. The patient has a follow-up appointment with referring surgeon, Dr. Daphine Deutscher, scheduled for tomorrow.  Past Medical History  Diagnosis Date  . Diverticulitis   . GERD (gastroesophageal reflux disease)     No past surgical history on file.  Allergies: Food  Medications: Prior to Admission medications   Medication Sig Start Date End Date Taking? Authorizing Provider  amoxicillin-clavulanate (AUGMENTIN) 875-125 MG per tablet Take 1 tablet by mouth every 12 (twelve) hours. 11/30/14   Nonie Hoyer, PA-C  HYDROcodone-acetaminophen (NORCO/VICODIN) 5-325 MG per tablet Take 1-2 tablets by mouth every 6 (six) hours as needed for moderate pain or severe pain. 11/24/14   Nonie Hoyer, PA-C  ibuprofen (ADVIL,MOTRIN) 200 MG tablet Take 600 mg by mouth every 6 (six) hours as needed (for pain).    Historical Provider, MD  ranitidine (ZANTAC) 150 MG tablet Take 150 mg by mouth 2 (two) times daily.     Historical Provider, MD  tamsulosin (FLOMAX) 0.4 MG CAPS capsule Take 1 capsule (0.4 mg total) by mouth daily after breakfast. 11/24/14   Nonie Hoyer, PA-C     No family history on file.  Social History   Social History  . Marital Status: Single    Spouse Name: N/A  . Number of Children: N/A  . Years of Education: N/A   Social History Main Topics  . Smoking status: Former Games developer  . Smokeless tobacco: Not on file  . Alcohol Use: No  . Drug Use: Not on file  . Sexual Activity: Not on file   Other Topics Concern  . Not on file   Social History Narrative    ECOG Status: 0 - Asymptomatic  Review of Systems: A 12 point ROS discussed and pertinent positives are indicated in the HPI above.  All other systems are negative.  Review of Systems  Vital Signs: BP 118/61 mmHg  Pulse 78  Temp(Src) 98.1 F (36.7 C) (Oral)  SpO2 99%  Physical Exam  Abdominal:    Location of remaining drainage catheter     Imaging: Ct Abdomen Pelvis W Contrast  12/10/2014  CLINICAL DATA:  58 year old male with a history of diverticulitis and subsequent abscess formation secondary to perforation and peritoneal contamination. Initial CT image performed 11/17/2014 with follow-up 11/24/2014 demonstrating perforation. Two separate percutaneous drains placed 11/27/2014 into a separate abscess collections. Since this time, the patient reports only scant fluid drainage from each of the drains. He denies fevers, rigors, chills. EXAM: CT ABDOMEN AND PELVIS WITH CONTRAST TECHNIQUE: Multidetector CT imaging of the abdomen and pelvis was performed using the standard protocol following bolus administration of  intravenous contrast. CONTRAST:  ISOVUE-300 IOPAMIDOL (ISOVUE-300) INJECTION 61% COMPARISON:  CT 11/27/2014, 11/24/2014, 11/17/2014 FINDINGS: Lower chest: Unremarkable appearance of the soft tissues of the chest wall. Heart size within normal limits.  No pericardial fluid/thickening. No lower  mediastinal adenopathy. Unremarkable appearance of the distal esophagus. No hiatal hernia. No confluent airspace disease, pleural fluid, or pneumothorax within visualized lung. Abdomen/pelvis: Unremarkable appearance of liver and spleen. Unremarkable appearance of bilateral adrenal glands. No peripancreatic or pericholecystic fluid or inflammatory changes. Radiopaque gallstones. No pericholecystic fluid or inflammatory changes. No intrahepatic or extrahepatic biliary ductal dilatation. Interval resolution of intraperitoneal gas. Pigtail surgical drains within the lower abdomen again evident with 1 to the right of midline and 1 in the midline adjacent to sigmoid colon. No significant fluid collection adjacent to either drain. There is a small gas collection anterior to both the sigmoid colon and the drain next to the sigmoid colon with small amount of inflammatory changes. The gas collection measures approximately 16 mm. Diverticular disease again evident throughout the sigmoid colon. Appendix is not visualized, however, no inflammatory changes are present adjacent to the cecum to indicate an appendicitis. Right Kidney/Ureter: No hydronephrosis. No nephrolithiasis. No perinephric stranding. Unremarkable course of the right ureter. Left Kidney/Ureter: No hydronephrosis. No nephrolithiasis. No perinephric stranding. Unremarkable course of the left ureter. Unremarkable appearance of the urinary bladder. No intraluminal air. Calcifications of the prostate. Scattered vascular calcifications.  No aneurysm. Musculoskeletal: No displaced fracture identified. No significant degenerative changes of the spine. IMPRESSION: Interval resolution of intraperitoneal air, and a significant decrease in pericolonic inflammation adjacent to the sigmoid colon, compatible with resolved diverticulitis. Pigtail drainage catheters in the right lower abdomen and midline lower abdomen are unchanged in position from the placement 11/27/2014,  with no significant residual fluid. There is a tiny gas collection between the sigmoid colon and the midline catheter, which may be air related to drain flush. No evidence of persistent abscess. Cholelithiasis. Atherosclerosis. Signed, Yvone Neu. Loreta Ave, DO [facility] Radiology Electronically Signed   By: Gilmer Mor D.O.   On: 12/10/2014 14:08   Ct Abdomen Pelvis W Contrast  11/27/2014  ADDENDUM REPORT: 11/27/2014 13:00 ADDENDUM: These results were called by telephone at the time of interpretation on 11/27/2014 at 12:59 pm to Dr. Kristin Bruins nurse, Shon Baton, who verbally acknowledged these results and to indicated them to Dr. Daphine Deutscher was in her presence. Electronically Signed   By: Elberta Fortis M.D.   On: 11/27/2014 13:00  11/27/2014  CLINICAL DATA:  History of diverticulitis and abdominal abscess. No current abdominal complaints EXAM: CT ABDOMEN AND PELVIS WITH CONTRAST TECHNIQUE: Multidetector CT imaging of the abdomen and pelvis was performed using the standard protocol following bolus administration of intravenous contrast. CONTRAST:  OMNIPAQUE IOHEXOL 300 MG/ML  SOLN COMPARISON:  11/24/2014 FINDINGS: Lung bases demonstrate persistence of a small right pleural effusion with associated atelectasis. Improved tiny amount of left pleural fluid. Abdominal images continue to demonstrate moderate free peritoneal air. There is diverticulosis of the colon with possible acute diverticulitis involving the sigmoid colon without significant change. Moderate inflammatory change and multiple metastases within the lower abdomen most slightly larger compared to the prior exam. Presumed abscess over the right lower quadrant just below the cecum is stable in size measuring 2.6 x 3.3 cm. Largest presumed abscess increased in size with air-fluid level measures 5 x 6.6 cm (previously 3.9 x 5.1 cm). Two adjacent abscess cavities wrapped around a strictured small  bowel loop  just right of midline in the lower abdomen are increased in size. Most of these abscess cavities appear to communicate. There is new mild dilatation of multiple small bowel loops proximal to the strictured loop between the 2 abscesses described. There is minimal free fluid in the mesentery. There is mild a moderate cholelithiasis. The liver, pancreas, spleen and adrenal glands are within normal. Kidneys are normal in size without hydronephrosis or nephrolithiasis. There is a sub cm left renal cortical hypodensity unchanged likely a cyst but too small to characterize. Ureters are within normal. There is calcified plaque over the abdominal aorta. Appendix not well visualized. Pelvic images demonstrate the bladder, prostate and rectum to be normal. No free pelvic fluid. There are minimal degenerate changes of the spine and hips. IMPRESSION: Persistent mild to moderate free peritoneal air. Multiple mesenteric abscesses in the lower abdomen most with overall increase in size with the largest over the right lower quadrant containing air-fluid level measuring 5 x 6.6 cm (previously 3.9 x 5.1 cm). Most of these abscesses appear to communicate. Etiology of these abscesses and free air is unclear, possibly sequelae from diverticulitis of the sigmoid colon. There is evolving ileus versus obstruction of the small bowel with possible transition point over a strictured segment of distal small bowel between 2 of the above described abscess cavities in the lower abdomen just right of midline. Small right effusion with associated atelectasis unchanged. Improved tiny amount of left pleural fluid. Cholelithiasis. Sub cm left renal cortical hypodensity unchanged and likely a cyst but too small to characterize. Diverticulosis of the colon. Electronically Signed: By: Elberta Fortis M.D. On: 11/27/2014 12:43   Ct Abdomen Pelvis W Contrast  11/24/2014  CLINICAL DATA:  Diverticulitis. EXAM: CT ABDOMEN AND PELVIS WITH CONTRAST  TECHNIQUE: Multidetector CT imaging of the abdomen and pelvis was performed using the standard protocol following bolus administration of intravenous contrast. CONTRAST:  OMNIPAQUE IOHEXOL 300 MG/ML  SOLN COMPARISON:  CT 11/17/2014 FINDINGS: New small bilateral pleural effusions. Atelectasis in the right lower lobe. Heart is normal size. Multiple gallstones are again noted within the gallbladder. Liver, spleen, pancreas, adrenals, kidneys are normal. There is new moderate free air in the abdomen multiple focal gas and fluid collections noted in the lower abdomen and pelvis, the largest on image 58 measuring 5.1 x 3.9 cm. Continued diverticulosis and inflammatory change around the sigmoid colon compatible with active diverticulitis, the presumed source of the abscesses and free air. No evidence of bowel obstruction. Urinary bladder is decompressed. The bladder wall appears slightly thickened thickened and hyperemic, likely secondarily inflamed from adjacent sigmoid diverticulitis. IMPRESSION: New moderate pneumoperitoneum and multiple abscesses in the lower abdomen and pelvis, presumably from the ruptured diverticulitis. Cholelithiasis. Small bilateral pleural effusions. Right lower lobe compressive atelectasis. Critical Value/emergent results were called by telephone at the time of interpretation on 11/24/2014 at 10:54 am to [facility], PA-C , who verbally acknowledged these results. Electronically Signed   By: Charlett Nose M.D.   On: 11/24/2014 11:10   Dg Sinus/fist Tube Chk-non Gi  12/15/2014  CLINICAL DATA:  59 year old male with history of diverticulitis complicated by multifocal abscesses. He has 2 percutaneous drainage catheters in place. The slightly more lateral drain in the right lower quadrant over flows when flushed. Recent assessment performed last week on 12/10/2014 demonstrates a persistent fistulous connection with the midline drainage catheter and the sigmoid colon. EXAM: ABSCESS INJECTION  CONTRAST:  5 mL Omnipaque 350 FLUOROSCOPY TIME:  If the device does not  provide the exposure index: Fluoroscopy Time (in minutes and seconds):  12 seconds Number of Acquired Images:  0 COMPARISON:  Prior contrast injection 12/10/2014 FINDINGS: Contrast was injected through the right lower quadrant catheter. There is no evidence of fistulous communication with the colon. The abscess cavity is completely collapsed. Injected contrast material refluxes along the surface of the tube to the skin surface. IMPRESSION: 1. No evidence of residual abscess cavity or fistulous connection involving the right lower quadrant drainage catheter. 2. This catheter was removed. PLAN: 1. Flushing the midline drainage catheter. 2. Continue to record output. 3. Follow-up in drain clinic in 1-2 weeks with drain injection to assess for persistent fistula. Signed, Sterling Big, MD [facility] Radiology Electronically Signed   By: Malachy Moan M.D.   On: 12/15/2014 16:46   Dg Sinus/fist Tube Chk-non Gi  12/10/2014  CLINICAL DATA:  59 year old male with a history of diverticular abscess. EXAM: ABSCESS INJECTION CONTRAST:  10 cc FLUOROSCOPY TIME:  Fluoroscopy Time (in minutes and seconds): 24 seconds COMPARISON:  CT 12/10/2014, 11/27/2014 FINDINGS: Scout image demonstrates 2 pigtail catheter within the low abdomen. Retained contrast within the urinary bladder, status post contrast-enhanced CT. Injection of the pigtail catheter to the patient's right demonstrates no significant abscess. There is a small amount of contrast tracking to the adjacent drain within the peritoneum. Drain to the patient's left in the midline was injected with no significant abscess cavity. 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 IMPRESSION: Status post drain injection of 2 pigtail catheter drains in the low abdomen demonstrating no persisting abscess cavity adjacent to either drain.  There is a fistulous connection of the left-sided drain in the midline with the sigmoid colon. Signed, Yvone Neu. Loreta Ave, DO [facility] Radiology PLAN: The patient reports a surgical appointment on the following day September 30th. May return as needed to evaluate these drains. Electronically Signed   By: Gilmer Mor D.O.   On: 12/10/2014 14:15   Ct Image Guided Drainage By Percutaneous Catheter  12/01/2014  INDICATION: History of diverticular abscess. Request made for placement of percutaneous abdominal abscess drainage catheter(s). EXAM: CT IMAGE GUIDED DRAINAGE BY PERCUTANEOUS CATHETER x2 COMPARISON:  CT abdomen pelvis - 11/27/2014; 11/24/2014; 11/17/2014 MEDICATIONS: The patient is currently admitted to the hospital and receiving intravenous antibiotics. The antibiotics were administered within an appropriate time frame prior to the initiation of the procedure. ANESTHESIA/SEDATION: Fentanyl 100 mcg IV; Versed 6 mg IV Total Moderate Sedation time 39 minutes CONTRAST:  None COMPLICATIONS: None immediate PROCEDURE: Informed written consent was obtained from the patient after a discussion of the risks, benefits and alternatives to treatment. The patient was placed supine on the CT gantry and a pre procedural CT was performed re-demonstrating the known dominant abscess/fluid collection within the right lower abdominal quadrant with air and fluid containing component measuring approximately 5.7 x 4.5 cm (image 12, series 2) as well as an additional less well-defined approximately 3.3 x 4.7 cm fluid collection within the adjacent midline of the lower abdomen/ upper pelvis (image 20, series 2). The procedure was planned. The skin overlying the midline of the lower abdomen / upper pelvis was prepped and draped in the usual sterile fashion. A time-out was performed prior to initiation of the procedure. Attention was first paid towards the dominant approximately 5.7 cm  air and fluid containing abscess within the right lower abdominal quadrant. The overlying soft tissues were anesthetized with 1% lidocaine  with epinephrine. Appropriate trajectory was planned with the use of a 22 gauge spinal needle. An 18 gauge trocar needle was advanced into the abscess/fluid collection and a short Amplatz super stiff wire was coiled within the collection. Appropriate positioning was confirmed with a limited CT scan. The tract was serially dilated allowing placement of a 12 [place] all-purpose drainage catheter. Appropriate positioning was confirmed with a limited postprocedural CT scan. A small amount of purulent fluid was aspirated and a small sample was sent to the laboratory for analysis. Attention was now paid towards the less well-defined approximately 4.7 cm fluid collection within the midline of the lower abdomen/upper pelvis. The overlying soft tissues were anesthetized with 1% lidocaine with epinephrine. Appropriate trajectory was planned with the use of a 22 gauge spinal needle. An 18 gauge trocar needle was advanced into the abscess/fluid collection and a short Amplatz super stiff wire was coiled within the collection. Appropriate positioning was confirmed with a limited CT scan. The tract was serially dilated allowing placement of a 12 [place] all-purpose drainage catheter. Appropriate positioning was confirmed with a limited postprocedural CT scan. A small amount of purulent fluid was aspirated and a small sample was sent to the laboratory for analysis. Both percutaneous drainage catheters were connected to drainage bags and sutured in place. A dressing was placed. The patient tolerated the procedure well without immediate post procedural complication. IMPRESSION: 1. Successful CT guided placement of a 12 [place] all purpose drain catheter into the dominant approximately 5.7 cm air and fluid containing abscess within the right lower abdominal quadrant. A small sample of purulent fluid  from this dominant collection was sent to the laboratory for analysis. 2. Successful CT-guided placement of a 12 French all-purpose drainage catheter into an additional less well-defined fluid collection within the lower abdomen/upper pelvis. A small sample of purulent fluid aspirated from this abscess was sent to the laboratory for analysis. This procedure was performed by Dr. Fredia SorrowYamagata on 11/27/2014. Electronically Signed   By: Simonne ComeJohn  Kerra Guilfoil M.D.   On: 12/01/2014 16:44    Labs:  CBC:  Recent Labs  11/25/14 0820 11/27/14 0532 11/28/14 1051 11/29/14 0600  WBC 11.9* 13.9* 8.5 7.1  HGB 12.7* 13.1 13.4 13.3  HCT 37.5* 38.2* 38.6* 40.4  PLT 372 460* 519* 608*    COAGS:  Recent Labs  11/27/14 1453  INR 1.24  APTT 43*    BMP:  Recent Labs  11/17/14 1825 11/19/14 0710 11/25/14 0820 11/27/14 0532  NA 137 136 134* 135  K 4.2 4.5 4.0 4.3  CL 103 106 98* 95*  CO2 27 22 25 30   GLUCOSE 157* 121* 102* 114*  BUN 12 14 9 12   CALCIUM 8.9 8.2* 8.0* 8.7*  CREATININE 0.97 0.81 0.75 0.99  GFRNONAA >60 >60 >60 >60  GFRAA >60 >60 >60 >60    LIVER FUNCTION TESTS:  Recent Labs  11/17/14 1825 11/19/14 0710  BILITOT 1.8* 1.4*  AST 25 20  ALT 17 17  ALKPHOS 63 62  PROT 7.6 6.7  ALBUMIN 4.0 3.2*    TUMOR MARKERS: No results for input(s): AFPTM, CEA, CA199, CHROMGRNA in the last 8760 hours.  Assessment and Plan:  Alejandro MasseMichael [name] is a 59 y.o. male with history of sigmoid diverticulitis, located by perforation and multi focal abscess formation. Patient underwent placement of 2 percutaneous drainage catheters on 11/27/2014. The patient's cranially located percutaneous drainage catheter was removed on 12/15/2014 however the patient return to the interventional radiology clinic for evaluation remaining midline drainage  catheter.   Despite the fact the patient reports minimal to no output from the remaining percutaneous drainage catheter, contrast injection demonstrates a persistent tiny  fistulous connection to the adjacent sigmoid colon. As such, the percutaneous drainage catheter was not removed.   - The drainage catheter was switched from a JP bulb to a gravity bag.  - The patient was instructed to no longer flush the percutaneous drainage catheter and to continue to record the output from the percutaneous drain.  The patient was encouraged to keep his follow-up appointment with referring surgeon, Dr. Daphine Deutscher, scheduled for tomorrow if the patient is not deemed an operative candidate, he will return to the interventional radiology clinic in 2 weeks repeat fluoroscopic guided drainage catheter injection.  A copy of this report was sent to the requesting provider on this date.  SignedSimonne Come 12/23/2014, 5:06 PM   I spent a total of 10 Minutes in face to face in clinical consultation, greater than 50% of which was counseling/coordinating care for diverticular percutaneous drainage catheter

## 2014-12-24 ENCOUNTER — Other Ambulatory Visit: Payer: BLUE CROSS/BLUE SHIELD

## 2015-01-05 ENCOUNTER — Ambulatory Visit
Admission: RE | Admit: 2015-01-05 | Discharge: 2015-01-05 | Disposition: A | Discharge status: Home / Self Care | Payer: BLUE CROSS/BLUE SHIELD | Source: Ambulatory Visit | Attending: Interventional Radiology | Admitting: Interventional Radiology

## 2015-01-05 ENCOUNTER — Other Ambulatory Visit (HOSPITAL_COMMUNITY): Payer: Self-pay | Admitting: Interventional Radiology

## 2015-01-05 DIAGNOSIS — K651 Peritoneal abscess: Secondary | ICD-10-CM

## 2015-01-05 NOTE — Progress Notes (Signed)
Patient ID: [name], male   DOB: 24-Feb-1956, 59 y.o.   MRN: [id number]       Chief Complaint: Diverticular abscess drain follow-up  Referring Physician(s): Wenda LowMartin, matt  History of Present Illness: [name] is a 59 y.o. male with a history of sigmoid diverticulitis and a diverticular abscess. He returns for outpatient follow-up. He has one remaining anterior pelvic abscess drain. Follow-up imaging confirmed resolution of the abscess. 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  He has completed antibiotics. Minimal drain output. He no longer is flushing the catheter. No fever or chills. No significant abdominal pain. He is tolerating his diet. Stable weight.  Past Medical History  Diagnosis Date  . Diverticulitis   . GERD (gastroesophageal reflux disease)     No past surgical history on file.  Allergies: Food  Medications: Prior to Admission medications   Medication Sig Start Date End Date Taking? Authorizing Provider  amoxicillin-clavulanate (AUGMENTIN) 875-125 MG per tablet Take 1 tablet by mouth every 12 (twelve) hours. 11/30/14   Nonie HoyerMegan N Baird, PA-C  HYDROcodone-acetaminophen (NORCO/VICODIN) 5-325 MG per tablet Take 1-2 tablets by mouth every 6 (six) hours as needed for moderate pain or severe pain. 11/24/14   Nonie HoyerMegan N Baird, PA-C  ibuprofen (ADVIL,MOTRIN) 200 MG tablet Take 600 mg by mouth every 6 (six) hours as needed (for pain).    Historical Provider, MD  ranitidine (ZANTAC) 150 MG tablet Take 150 mg by mouth 2 (two) times daily.    Historical Provider, MD  tamsulosin (FLOMAX) 0.4 MG CAPS capsule Take 1 capsule (0.4 mg total) by mouth daily after breakfast. 11/24/14   Nonie HoyerMegan N Baird, PA-C     No family history on file.  Social History   Social History  . Marital Status: Single    Spouse Name: N/A  . Number of Children: N/A  . Years of Education: N/A   Social History Main Topics  . Smoking  status: Former Games developermoker  . Smokeless tobacco: Not on file  . Alcohol Use: No  . Drug Use: Not on file  . Sexual Activity: Not on file   Other Topics Concern  . Not on file   Social History Narrative    Review of Systems: A 12 point ROS discussed and pertinent positives are indicated in the HPI above.  All other systems are negative.  Review of Systems  Vital Signs: BP 112/71 mmHg  Pulse 71  Temp(Src) 98 F (36.7 C) (Oral)  SpO2 100%  Physical Exam  Constitutional: He appears well-developed and well-nourished. No distress.  Abdominal: Soft. He exhibits no distension. There is no tenderness.  Lower abdominal drain site is clean, dry and intact. No signs of infection.  Skin: He is not diaphoretic.     Imaging: Ct Abdomen Pelvis W Contrast  12/10/2014  CLINICAL DATA:  59 year old male with a history of diverticulitis and subsequent abscess formation secondary to perforation and peritoneal contamination. Initial CT image performed 11/17/2014 with follow-up 11/24/2014 demonstrating perforation. Two separate percutaneous drains placed 11/27/2014 into a separate abscess collections. Since this time, the patient reports only scant fluid drainage from each of the drains. He denies fevers, rigors, chills. EXAM: CT ABDOMEN AND PELVIS WITH CONTRAST TECHNIQUE: Multidetector CT imaging of the abdomen and pelvis was performed using the standard protocol following bolus administration of intravenous contrast. CONTRAST:  100mL ISOVUE-300 IOPAMIDOL (ISOVUE-300) INJECTION 61% COMPARISON:  CT 11/27/2014, 11/24/2014, 11/17/2014 FINDINGS: Lower chest: Unremarkable appearance  of the soft tissues of the chest wall. Heart size within normal limits.  No pericardial fluid/thickening. No lower mediastinal adenopathy. Unremarkable appearance of the distal esophagus. No hiatal hernia. No confluent airspace disease, pleural fluid, or pneumothorax within visualized lung. Abdomen/pelvis: Unremarkable appearance of  liver and spleen. Unremarkable appearance of bilateral adrenal glands. No peripancreatic or pericholecystic fluid or inflammatory changes. Radiopaque gallstones. No pericholecystic fluid or inflammatory changes. No intrahepatic or extrahepatic biliary ductal dilatation. Interval resolution of intraperitoneal gas. Pigtail surgical drains within the lower abdomen again evident with 1 to the right of midline and 1 in the midline adjacent to sigmoid colon. No significant fluid collection adjacent to either drain. There is a small gas collection anterior to both the sigmoid colon and the drain next to the sigmoid colon with small amount of inflammatory changes. The gas collection measures approximately 16 mm. Diverticular disease again evident throughout the sigmoid colon. Appendix is not visualized, however, no inflammatory changes are present adjacent to the cecum to indicate an appendicitis. Right Kidney/Ureter: No hydronephrosis. No nephrolithiasis. No perinephric stranding. Unremarkable course of the right ureter. Left Kidney/Ureter: No hydronephrosis. No nephrolithiasis. No perinephric stranding. Unremarkable course of the left ureter. Unremarkable appearance of the urinary bladder. No intraluminal air. Calcifications of the prostate. Scattered vascular calcifications.  No aneurysm. Musculoskeletal: No displaced fracture identified. No significant degenerative changes of the spine. IMPRESSION: Interval resolution of intraperitoneal air, and a significant decrease in pericolonic inflammation adjacent to the sigmoid colon, compatible with resolved diverticulitis. Pigtail drainage catheters in the right lower abdomen and midline lower abdomen are unchanged in position from the placement 11/27/2014, with no significant residual fluid. There is a tiny gas collection between the sigmoid colon and the midline catheter, which may be air related to drain flush. No evidence of persistent abscess. Cholelithiasis.  Atherosclerosis. Signed, Yvone Neu. Loreta Ave, DO [facility] Radiology Electronically Signed   By: Gilmer Mor D.O.   On: 12/10/2014 14:08   Dg Sinus/fist Tube Chk-non Gi  12/23/2014  CLINICAL DATA:  History of diverticular abscess post CT-guided percutaneous drainage catheter placement on 11/27/2014. Patient and subsequently returned to the interventional radiology drain Clinic on 12/10/2014 or CT scanning demonstrated resolution of previously noted diverticular abscess ease. Drainage catheter injection performed 12/15/2014 demonstrated resolution of the abscess about the more cranially located percutaneous drainage catheter without evidence of fistulous connection and as such, this percutaneous drainage catheter was removed. Patient returns today to the interventional radiology clinic for repeat fluoroscopic guided percutaneous drainage catheter injection and management. EXAM: ABSCESS INJECTION COMPARISON:  CT scan abdomen and pelvis - 11/27/2014; 12/10/2014; percutaneous drainage catheter placement x2 - 11/27/2014; fluoroscopic guided percutaneous drainage catheter injection - 12/15/2014; 12/10/2014 CONTRAST:  10 cc Omnipaque 300, administered via the remaining percutaneous drainage catheter FLUOROSCOPY TIME:  18 seconds TECHNIQUE: The patient was positioned supine on the fluoroscopy table. A preprocedural spot fluoroscopic image was obtained of the lower pelvis and existing percutaneous drainage catheter. Multiple spot fluoroscopic images were obtained following the injection of a small amount of contrast via the remaining percutaneous drainage catheter. Images were reviewed and the percutaneous drainage catheter was reconnected to a gravity bag. A dressing was placed. The patient tolerated the procedure well without immediate postprocedural complication. FINDINGS: Preprocedural spot fluoroscopic image demonstrates unchanged positioning of the remaining  percutaneous drainage catheter with end coiled and locked over the midline of the lower abdomen/ upper pelvis. Contrast injection demonstrates resolution of previously noted abscess about the percutaneous drainage  catheter though there is a persistent fistulous connection to the adjacent sigmoid colon. IMPRESSION: Appropriately positioned and functioning percutaneous drainage catheter with decompressed last resolved abscess cavity but with persistent tiny fistulous connection to the adjacent sigmoid colon. PLAN: 1. No longer flush remaining percutaneous drainage catheter. 2. Maintain remaining percutaneous drainage catheter to a gravity bag. 3. Continued to record output from the percutaneous drainage catheter. 4. The patient was encouraged to keep his follow-up appointment with Dr. Daphine Deutscher scheduled for tomorrow. If the patient is not deemed an operative candidate, the patient may return to the interventional radiology clinic in 2 weeks for repeat fluoroscopic guided drainage catheter injection to evaluate for residual fistulous connection. Electronically Signed   By: Simonne Come M.D.   On: 12/23/2014 17:05   Dg Sinus/fist Tube Chk-non Gi  12/15/2014  CLINICAL DATA:  59 year old male with history of diverticulitis complicated by multifocal abscesses. He has 2 percutaneous drainage catheters in place. The slightly more lateral drain in the right lower quadrant over flows when flushed. Recent assessment performed last week on 12/10/2014 demonstrates a persistent fistulous connection with the midline drainage catheter and the sigmoid colon. EXAM: ABSCESS INJECTION CONTRAST:  5 mL Omnipaque 350 FLUOROSCOPY TIME:  If the device does not provide the exposure index: Fluoroscopy Time (in minutes and seconds):  12 seconds Number of Acquired Images:  0 COMPARISON:  Prior contrast injection 12/10/2014 FINDINGS: Contrast was injected through the right lower quadrant catheter. There is no evidence of fistulous communication  with the colon. The abscess cavity is completely collapsed. Injected contrast material refluxes along the surface of the tube to the skin surface. IMPRESSION: 1. No evidence of residual abscess cavity or fistulous connection involving the right lower quadrant drainage catheter. 2. This catheter was removed. PLAN: 1. Flushing the midline drainage catheter. 2. Continue to record output. 3. Follow-up in drain clinic in 1-2 weeks with drain injection to assess for persistent fistula. Signed, Sterling Big, MD [facility] Radiology Electronically Signed   By: Malachy Moan M.D.   On: 12/15/2014 16:46   Dg Sinus/fist Tube Chk-non Gi  12/10/2014  CLINICAL DATA:  60 year old male with a history of diverticular abscess. EXAM: ABSCESS INJECTION CONTRAST:  10 cc FLUOROSCOPY TIME:  Fluoroscopy Time (in minutes and seconds): 24 seconds COMPARISON:  CT 12/10/2014, 11/27/2014 FINDINGS: Scout image demonstrates 2 pigtail catheter within the low abdomen. Retained contrast within the urinary bladder, status post contrast-enhanced CT. Injection of the pigtail catheter to the patient's right demonstrates no significant abscess. There is a small amount of contrast tracking to the adjacent drain within the peritoneum. Drain to the patient's left in the midline was injected with no significant abscess cavity. 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 IMPRESSION: Status post drain injection of 2 pigtail catheter drains in the low abdomen demonstrating no persisting abscess cavity adjacent to either drain. There is a fistulous connection of the left-sided drain in the midline with the sigmoid colon. Signed, Yvone Neu. Loreta Ave, DO [facility] Radiology PLAN: The patient reports a surgical appointment on the following day September 30th. May return as needed to evaluate these drains. Electronically Signed   By: Gilmer Mor D.O.   On: 12/10/2014 14:15    Labs:  CBC:  Recent Labs  11/25/14 0820 11/27/14 0532 11/28/14 1051 11/29/14 0600  WBC 11.9* 13.9* 8.5 7.1  HGB 12.7* 13.1 13.4 13.3  HCT 37.5* 38.2* 38.6* 40.4  PLT  372 460* 519* 608*    COAGS:  Recent Labs  11/27/14 1453  INR 1.24  APTT 43*    BMP:  Recent Labs  11/17/14 1825 11/19/14 0710 11/25/14 0820 11/27/14 0532  NA 137 136 134* 135  K 4.2 4.5 4.0 4.3  CL 103 106 98* 95*  CO2 GLUCOSE 157* 121* 102* 114*  BUN CALCIUM 8.9 8.2* 8.0* 8.7*  CREATININE 0.97 0.81 0.75 0.99  GFRNONAA >60 >60 >60 >60  GFRAA >60 >60 >60 >60    LIVER FUNCTION TESTS:  Recent Labs  11/17/14 1825 11/19/14 0710  BILITOT 1.8* 1.4*  AST 25 20  ALT 17 17  ALKPHOS 63 62  PROT 7.6 6.7  ALBUMIN 4.0 3.2*     Assessment and Plan:  Pelvic diverticular abscess. Status post percutaneous drain. 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 This has not changed compared to 2 weeks ago.  Plan: Continue external gravity drainage without Flushing. No further cross-sectional imaging.  Repeat drain injection in 2 weeks.     SignedLAJARVIS, ITALIANO 01/05/2015, 3:36 PM   I spent a total of    15 Minutes in face to face in clinical consultation, greater than 50% of which was counseling/coordinating care for this patient with a pelvic diverticular abscess drain.

## 2015-01-19 ENCOUNTER — Ambulatory Visit
Admission: RE | Admit: 2015-01-19 | Discharge: 2015-01-19 | Disposition: A | Discharge status: Home / Self Care | Payer: BLUE CROSS/BLUE SHIELD | Source: Ambulatory Visit | Attending: Interventional Radiology | Admitting: Interventional Radiology

## 2015-01-19 ENCOUNTER — Other Ambulatory Visit (HOSPITAL_COMMUNITY): Payer: Self-pay | Admitting: Interventional Radiology

## 2015-01-19 DIAGNOSIS — K651 Peritoneal abscess: Secondary | ICD-10-CM

## 2015-01-19 NOTE — Progress Notes (Signed)
Patient ID: [name], male   DOB: 08/04/1955, 59 y.o.   MRN: [id number]   Referring Physician(s): Martin,Matt  Chief Complaint: The patient is seen in follow up today s/p drainage of diverticular abscesses  on 12/01/14.  History of present illness: [name] is a 59 year old white male, patient of Dr. Wenda LowMatt Martin, with history of diverticulitis and prior drainage of pelvic abscesses 2 on 12/01/14. 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 A previously placed right lower quadrant drain catheter was removed on 12/15/14. He presents today for follow-up drain injection and exam. According to the patient he has been stable since his last exam. He has had minimal output from the midline drain catheter. It is currently attached to gravity bag. He was instructed not to flush the drain and has not done so since his last procedure. The only significant complaint is some soreness at the drain site. He denies recent fevers, chills, nausea, vomiting or abnormal bleeding. He is scheduled to follow with Dr. Daphine DeutscherMartin on 01/21/15.   Past Medical History  Diagnosis Date  . Diverticulitis   . GERD (gastroesophageal reflux disease)     No past surgical history on file.  Allergies: Food  Medications: Prior to Admission medications   Medication Sig Start Date End Date Taking? Authorizing Provider  amoxicillin-clavulanate (AUGMENTIN) 875-125 MG per tablet Take 1 tablet by mouth every 12 (twelve) hours. 11/30/14   Nonie HoyerMegan N Baird, PA-C  HYDROcodone-acetaminophen (NORCO/VICODIN) 5-325 MG per tablet Take 1-2 tablets by mouth every 6 (six) hours as needed for moderate pain or severe pain. 11/24/14   Nonie HoyerMegan N Baird, PA-C  ibuprofen (ADVIL,MOTRIN) 200 MG tablet Take 600 mg by mouth every 6 (six) hours as needed (for pain).    Historical Provider, MD  ranitidine (ZANTAC) 150 MG tablet Take 150 mg by  mouth 2 (two) times daily.    Historical Provider, MD  tamsulosin (FLOMAX) 0.4 MG CAPS capsule Take 1 capsule (0.4 mg total) by mouth daily after breakfast. 11/24/14   Nonie HoyerMegan N Baird, PA-C     No family history on file.  Social History   Social History  . Marital Status: Single    Spouse Name: N/A  . Number of Children: N/A  . Years of Education: N/A   Social History Main Topics  . Smoking status: Former Games developermoker  . Smokeless tobacco: Not on file  . Alcohol Use: No  . Drug Use: Not on file  . Sexual Activity: Not on file   Other Topics Concern  . Not on file   Social History Narrative     Vital Signs: BP 115/82 mmHg  Pulse 67  Temp(Src) 98 F (36.7 C) (Oral)  SpO2 100%  Physical Exam patient is awake, alert. Midline abdominal drain intact, insertion site okay, site mildly tender to palpation, small amount of feculent output noted in drain bag.  Imaging: No results found.  Labs:  CBC:  Recent Labs  11/25/14 0820 11/27/14 0532 11/28/14 1051 11/29/14 0600  WBC 11.9* 13.9* 8.5 7.1  HGB 12.7* 13.1 13.4 13.3  HCT 37.5* 38.2* 38.6* 40.4  PLT 372 460* 519* 608*    COAGS:  Recent Labs  11/27/14 1453  INR 1.24  APTT 43*    BMP:  Recent Labs  11/17/14 1825 11/19/14 0710 11/25/14 0820 11/27/14 0532  NA 137 136 134* 135  K 4.2 4.5 4.0 4.3  CL 103  106 98* 95*  CO2 GLUCOSE 157* 121* 102* 114*  BUN CALCIUM 8.9 8.2* 8.0* 8.7*  CREATININE 0.97 0.81 0.75 0.99  GFRNONAA >60 >60 >60 >60  GFRAA >60 >60 >60 >60    LIVER FUNCTION TESTS:  Recent Labs  11/17/14 1825 11/19/14 0710  BILITOT 1.8* 1.4*  AST 25 20  ALT 17 17  ALKPHOS 63 62  PROT 7.6 6.7  ALBUMIN 4.0 3.2*    Assessment: Patient status post drainage of diverticular abscess on 12/01/14 with 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 Drain injection study today reveals persistent communication of the abscess cavity with sigmoid colon. Patient scheduled for follow-up  with Dr. Daphine Deutscher on 01/21/15.   Signed: D. Jeananne Rama 01/19/2015, 11:24 AM   Please refer to Dr. Henri Medal attestation of this note for management and plan.

## 2015-02-03 ENCOUNTER — Ambulatory Visit
Admission: RE | Admit: 2015-02-03 | Discharge: 2015-02-03 | Disposition: A | Discharge status: Home / Self Care | Payer: BLUE CROSS/BLUE SHIELD | Source: Ambulatory Visit | Attending: Interventional Radiology | Admitting: Interventional Radiology

## 2015-02-03 DIAGNOSIS — K651 Peritoneal abscess: Secondary | ICD-10-CM

## 2015-02-03 NOTE — Progress Notes (Signed)
Referring Physician(s): Dr Wenda LowMatt Martin  Chief Complaint: The patient is seen in follow up today s/p Diverticular abscess drain placed 12/01/2014  History of present illness:  Pt originally had 2 drains placed 12/01/14  diveritcular abscesses RLQ and mid low abd drains RLQ was removed after drain injection 12/15/14 At that time low abdomen drain was still showing fistula to colon Serial injections continued to reveal fistula Now scheduled for yet another injection today Has stopped flushing drain Very little output at all Bag today shows maybe 10 cc serous fluid---this is a 3-4 day collection per pt  Pt has seen Dr Daphine DeutscherMartin 11/10 Discussed surgical intervention and has had this scheduled for 02/26/15    Past Medical History  Diagnosis Date  . Diverticulitis   . GERD (gastroesophageal reflux disease)     No past surgical history on file.  Allergies: Food  Medications: Prior to Admission medications   Medication Sig Start Date End Date Taking? Authorizing Provider  amoxicillin-clavulanate (AUGMENTIN) 875-125 MG per tablet Take 1 tablet by mouth every 12 (twelve) hours. 11/30/14   Nonie HoyerMegan N Baird, PA-C  HYDROcodone-acetaminophen (NORCO/VICODIN) 5-325 MG per tablet Take 1-2 tablets by mouth every 6 (six) hours as needed for moderate pain or severe pain. 11/24/14   Nonie HoyerMegan N Baird, PA-C  ibuprofen (ADVIL,MOTRIN) 200 MG tablet Take 600 mg by mouth every 6 (six) hours as needed (for pain).    Historical Provider, MD  ranitidine (ZANTAC) 150 MG tablet Take 150 mg by mouth 2 (two) times daily.    Historical Provider, MD  tamsulosin (FLOMAX) 0.4 MG CAPS capsule Take 1 capsule (0.4 mg total) by mouth daily after breakfast. 11/24/14   Nonie HoyerMegan N Baird, PA-C     No family history on file.  Social History   Social History  . Marital Status: Single    Spouse Name: N/A  . Number of Children: N/A  . Years of Education: N/A   Social History Main Topics  . Smoking status: Former Games developermoker  .  Smokeless tobacco: Not on file  . Alcohol Use: No  . Drug Use: Not on file  . Sexual Activity: Not on file   Other Topics Concern  . Not on file   Social History Narrative     Vital Signs: T: 98.1; BP: 111/73; O2 sat: 99% RA; P: 72  Physical Exam  Skin: Skin is warm and dry.  Drain site is clean and dry No bleeding Sl tender Output minimal- serous 10 cc total over 3-4 days     Imaging: No results found.  Labs:  CBC:  Recent Labs  11/25/14 0820 11/27/14 0532 11/28/14 1051 11/29/14 0600  WBC 11.9* 13.9* 8.5 7.1  HGB 12.7* 13.1 13.4 13.3  HCT 37.5* 38.2* 38.6* 40.4  PLT 372 460* 519* 608*    COAGS:  Recent Labs  11/27/14 1453  INR 1.24  APTT 43*    BMP:  Recent Labs  11/17/14 1825 11/19/14 0710 11/25/14 0820 11/27/14 0532  NA 137 136 134* 135  K 4.2 4.5 4.0 4.3  CL 103 106 98* 95*  CO2 27 22 25 30   GLUCOSE 157* 121* 102* 114*  BUN 12 14 9 12   CALCIUM 8.9 8.2* 8.0* 8.7*  CREATININE 0.97 0.81 0.75 0.99  GFRNONAA >60 >60 >60 >60  GFRAA >60 >60 >60 >60    LIVER FUNCTION TESTS:  Recent Labs  11/17/14 1825 11/19/14 0710  BILITOT 1.8* 1.4*  AST 25 20  ALT 17 17  ALKPHOS 63  62  PROT 7.6 6.7  ALBUMIN 4.0 3.2*    Assessment:  Diverticular abscess Low abdominal drain intact Drain injection shows NO communication to bowel Dr Archer Asa discussed with Dr Meridee Score was removed without difficulty Plan to follow with Dr Daphine Deutscher  Signed: Ralene Muskrat A 02/03/2015, 10:27 AM   Please refer to Dr. Archer Asa attestation of this note for management and plan.

## 2015-02-18 ENCOUNTER — Ambulatory Visit: Payer: Self-pay | Admitting: Surgery

## 2015-02-18 NOTE — Progress Notes (Signed)
No orders in epic for surgical procedure. Please place orders. Patient has PAT appt Friday 02/19/2015. Thanks.

## 2015-02-19 ENCOUNTER — Encounter (HOSPITAL_COMMUNITY)
Admission: RE | Admit: 2015-02-19 | Discharge: 2015-02-19 | Disposition: A | Discharge status: Home / Self Care | Payer: BLUE CROSS/BLUE SHIELD | Source: Ambulatory Visit | Attending: Surgery | Admitting: Surgery

## 2015-02-19 ENCOUNTER — Encounter (HOSPITAL_COMMUNITY): Payer: Self-pay

## 2015-02-19 DIAGNOSIS — Z01812 Encounter for preprocedural laboratory examination: Secondary | ICD-10-CM | POA: Insufficient documentation

## 2015-02-19 HISTORY — DX: Hesitancy of micturition: R39.11

## 2015-02-19 HISTORY — DX: Proteinuria, unspecified: R80.9

## 2015-02-19 HISTORY — DX: Benign prostatic hyperplasia without lower urinary tract symptoms: N40.0

## 2015-02-19 HISTORY — DX: Unspecified fracture of lower end of left humerus, initial encounter for closed fracture: S42.402A

## 2015-02-19 HISTORY — DX: Reserved for concepts with insufficient information to code with codable children: IMO0002

## 2015-02-19 HISTORY — DX: Dizziness and giddiness: R42

## 2015-02-19 HISTORY — DX: Personal history of other diseases of urinary system: Z87.448

## 2015-02-19 HISTORY — DX: Calculus of gallbladder without cholecystitis without obstruction: K80.20

## 2015-02-19 HISTORY — DX: Anemia, unspecified: D64.9

## 2015-02-19 HISTORY — DX: Personal history of other specified conditions: Z87.898

## 2015-02-19 LAB — CBC
HCT: 43.2 % (ref 39.0–52.0)
Hemoglobin: 14.7 g/dL (ref 13.0–17.0)
MCH: 33.9 pg (ref 26.0–34.0)
MCHC: 34 g/dL (ref 30.0–36.0)
MCV: 99.5 fL (ref 78.0–100.0)
PLATELETS: 285 10*3/uL (ref 150–400)
RBC: 4.34 MIL/uL (ref 4.22–5.81)
RDW: 13.1 % (ref 11.5–15.5)
WBC: 4.5 10*3/uL (ref 4.0–10.5)

## 2015-02-19 LAB — BASIC METABOLIC PANEL
Anion gap: 6 (ref 5–15)
BUN: 15 mg/dL (ref 6–20)
CO2: 28 mmol/L (ref 22–32)
CREATININE: 0.96 mg/dL (ref 0.61–1.24)
Calcium: 8.9 mg/dL (ref 8.9–10.3)
Chloride: 105 mmol/L (ref 101–111)
GFR calc Af Amer: >60 mL/min (ref >60)
GLUCOSE: 98 mg/dL (ref 65–99)
Potassium: 4.1 mmol/L (ref 3.5–5.1)
SODIUM: 139 mmol/L (ref 135–145)

## 2015-02-19 LAB — ABO/RH: ABO/RH(D): O POS

## 2015-02-19 NOTE — Progress Notes (Signed)
CT abd / pelvis epic 12/10/2014

## 2015-02-19 NOTE — Patient Instructions (Addendum)
Athena MasseMichael Teems  02/19/2015   Your procedure is scheduled on: Monday March 01, 2015   [facility]  Entrance [place]  elevators to 3rd floor to  Short Stay Center at 12:00 PM.  Call this number if you have problems the morning of surgery [phone]   Remember: ONLY 1 PERSON MAY GO WITH YOU TO SHORT STAY TO GET  READY MORNING OF YOUR SURGERY.  Do not eat food After Midnight but may take clear liquids till 8:00 am day of surgery then nothing by mouth.      Take these medicines the morning of surgery with A SIP OF WATER:  Ranitidine                                You may not have any metal on your body including hair pins and              piercings  Do not wear jewelry,  lotions, powders or colognes, deodorant                          Men may shave face and neck.   Do not bring valuables to the hospital. [facility] IS NOT             RESPONSIBLE   FOR VALUABLES.  Contacts, dentures or bridgework may not be worn into surgery.  Leave suitcase in the car. After surgery it may be brought to your room.      Special Instructions: FOLLOW MD INSTRUCTION IN REGARDS TO BOWEL PREPARATION PRIOR TO SURGICAL PROCEDURE            _____________________________________________________________________             [facility] - Preparing [facility] Before surgery, you can play an important role.  Because skin is not sterile, your skin needs to be as free of germs as possible.  You can reduce the number of germs on your skin by washing with CHG (chlorahexidine gluconate) soap before surgery.  CHG is an antiseptic cleaner which kills germs and bonds with the skin to continue killing germs even after washing. Please DO NOT use if you have an allergy to CHG or antibacterial soaps.  If your skin becomes reddened/irritated stop using the CHG and inform your nurse when you arrive at Short Stay. Do not shave (including legs and underarms) for at least 48 hours prior  to the first CHG shower.  You may shave your face/neck. Please follow these instructions carefully:  1.  Shower with CHG Soap the night before surgery and the  morning of Surgery.  2.  If you choose to wash your hair, wash your hair first as usual with your  normal  shampoo.  3.  After you shampoo, rinse your hair and body thoroughly to remove the  shampoo.                           4.  Use CHG as you would any other liquid soap.  You can apply chg directly  to the skin and wash                       Gently with a scrungie or clean washcloth.  5.  Apply the CHG Soap to your body ONLY FROM THE NECK DOWN.   Do not use on face/ open                           Wound or open sores. Avoid contact with eyes, ears mouth and genitals (private parts).                       Wash face,  Genitals (private parts) with your normal soap.             6.  Wash thoroughly, paying special attention to the area where your surgery  will be performed.  7.  Thoroughly rinse your body with warm water from the neck down.  8.  DO NOT shower/wash with your normal soap after using and rinsing off  the CHG Soap.                9.  Pat yourself dry with a clean towel.            10.  Wear clean pajamas.            11.  Place clean sheets on your bed the night of your first shower and do not  sleep with pets. Day of Surgery : Do not apply any lotions/deodorants the morning of surgery.  Please wear clean clothes to the hospital/surgery center.  FAILURE TO FOLLOW THESE INSTRUCTIONS MAY RESULT IN THE CANCELLATION OF YOUR SURGERY PATIENT SIGNATURE_________________________________  NURSE SIGNATURE__________________________________  ________________________________________________________________________    CLEAR LIQUID DIET   Foods Allowed                                                                     Foods Excluded  Coffee and tea, regular and decaf                             liquids that you cannot  Plain Jell-O  in any flavor                                             see through such as: Fruit ices (not with fruit pulp)                                     milk, soups, orange juice  Iced Popsicles                                    All solid food Carbonated beverages, regular and diet                                    Cranberry, grape and apple juices Sports drinks like Gatorade Lightly seasoned clear broth or consume(fat free) Sugar, honey syrup  Sample Menu Breakfast  Lunch                                     Supper Cranberry juice                    Beef broth                            Chicken broth Jell-O                                     Grape juice                           Apple juice Coffee or tea                        Jell-O                                      Popsicle                                                Coffee or tea                        Coffee or tea  _____________________________________________________________________

## 2015-02-19 NOTE — Progress Notes (Signed)
Clarification needed to surgical consent secondary to abbreviation noted. Thanks.

## 2015-02-20 LAB — HEMOGLOBIN A1C
HEMOGLOBIN A1C: 5.2 % (ref 4.8–5.6)
Mean Plasma Glucose: 103 mg/dL

## 2015-02-22 NOTE — Progress Notes (Addendum)
Spoke with pt on 02/22/2015 in regards to surgical time change. Pt is aware of time change and to be here at short stay at 7:30am 03/01/2015  and to disregard clear liquid diet instructions given at PAT appt but to have nothing by mouth after midnight. Pt verbalized understanding and stated that is he currently traveling and requested the information to also be left on VM in which this nurse also did. Also left return call back number [phone] if any further questions.

## 2015-02-28 NOTE — H&P (Signed)
Chief Complaint:  Complicated diverticultiits  History of Present Illness:  Alejandro MasseMichael [name] is an 59 y.o. male who presented to [facility] back in Sept with pelvic sepsis from perforated diverticulitis.  He was managed with a percutaneous drain which was finally pullled a couple of weeks ago.  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  He comes in for sigmoid colectomy with possible colostomy.    Past Medical History  Diagnosis Date  . Diverticulitis   . GERD (gastroesophageal reflux disease)   . Dizziness     with bending over occas  . Urinary hesitancy   . Anemia     in childhood   . Elbow fracture, left     history of   . Laceration     right hand / sutured secondary to cut   . H/O abdominal abscess   . Cholelithiasis   . History of hematuria   . Proteinuria   . BPH (benign prostatic hyperplasia)     Past Surgical History  Procedure Laterality Date  . Wisdom tooth extraction      4  . Percutaneous drains      history of     No current facility-administered medications for this encounter.   Current Outpatient Prescriptions  Medication Sig Dispense Refill  . ibuprofen (ADVIL,MOTRIN) 200 MG tablet Take 600 mg by mouth every 6 (six) hours as needed (for pain).    . ranitidine (ZANTAC) 150 MG tablet Take 150 mg by mouth 2 (two) times daily as needed for heartburn.     Marland Kitchen. HYDROcodone-acetaminophen (NORCO/VICODIN) 5-325 MG per tablet Take 1-2 tablets by mouth every 6 (six) hours as needed for moderate pain or severe pain. 30 tablet 0  . tamsulosin (FLOMAX) 0.4 MG CAPS capsule Take 1 capsule (0.4 mg total) by mouth daily after breakfast. 30 capsule 0   Food No family history on file. Social History:   reports that he has never smoked. He has never used smokeless tobacco. He reports that he drinks alcohol. He reports that he uses illicit drugs (Marijuana).   REVIEW OF SYSTEMS : Negative except for see problem list  Physical Exam:   There were no vitals taken for this visit. There  is no weight on file to calculate BMI.  Gen:  WDWN wM NAD  Neurological: Alert and oriented to person, place, and time. Motor and sensory function is grossly intact  Head: Normocephalic and atraumatic.  Eyes: Conjunctivae are normal. Pupils are equal, round, and reactive to light. No scleral icterus.  Neck: Normal range of motion. Neck supple. No tracheal deviation or thyromegaly present.  Cardiovascular:  SR without murmurs or gallops.  No carotid bruits Breast:  Not examined Respiratory: Effort normal.  No respiratory distress. No chest wall tenderness. Breath sounds normal.  No wheezes, rales or rhonchi.  Abdomen:  Nontender;  Lower abdominal drain site GU:  male Musculoskeletal: Normal range of motion. Extremities are nontender. No cyanosis, edema or clubbing noted Lymphadenopathy: No cervical, preauricular, postauricular or axillary adenopathy is present Skin: Skin is warm and dry. No rash noted. No diaphoresis. No erythema. No pallor. Pscyh: Normal mood and affect. Behavior is normal. Judgment and thought content normal.   LABORATORY RESULTS: No results found for this or any previous visit (from the past 48 hour(s)).   RADIOLOGY RESULTS: No results found.  Problem List: Patient Active Problem List   Diagnosis Date Noted  . Abscess of male pelvis (HCC)   . Abdominal abscess (HCC)   . BPH (benign  prostatic hyperplasia) 11/24/2014  . Hematuria 11/24/2014  . Proteinuria 11/24/2014  . Cholelithiasis 11/24/2014  . Diverticulitis 11/18/2014    Assessment & Plan: Complicated diverticulitis for sigmoid colectomy    Matt B. Daphine Deutscher, MD, [facility] [phone] beeper [phone]  02/28/2015 10:19 PM

## 2015-03-01 ENCOUNTER — Inpatient Hospital Stay (HOSPITAL_COMMUNITY): Payer: BLUE CROSS/BLUE SHIELD | Admitting: Registered Nurse

## 2015-03-01 ENCOUNTER — Encounter (HOSPITAL_COMMUNITY): Payer: Self-pay | Admitting: *Deleted

## 2015-03-01 ENCOUNTER — Inpatient Hospital Stay (HOSPITAL_COMMUNITY)
Admission: RE | Admit: 2015-03-01 | Discharge: 2015-03-05 | DRG: 330 | Disposition: A | Discharge status: Home / Self Care | Payer: BLUE CROSS/BLUE SHIELD | Source: Ambulatory Visit | Attending: Surgery | Admitting: Surgery

## 2015-03-01 ENCOUNTER — Encounter (HOSPITAL_COMMUNITY)
Admission: RE | Disposition: A | Discharge status: Home / Self Care | Payer: Self-pay | Source: Ambulatory Visit | Attending: Surgery

## 2015-03-01 DIAGNOSIS — K219 Gastro-esophageal reflux disease without esophagitis: Secondary | ICD-10-CM | POA: Diagnosis present

## 2015-03-01 DIAGNOSIS — Z01812 Encounter for preprocedural laboratory examination: Secondary | ICD-10-CM

## 2015-03-01 DIAGNOSIS — K5732 Diverticulitis of large intestine without perforation or abscess without bleeding: Secondary | ICD-10-CM | POA: Diagnosis present

## 2015-03-01 DIAGNOSIS — K5720 Diverticulitis of large intestine with perforation and abscess without bleeding: Principal | ICD-10-CM | POA: Diagnosis present

## 2015-03-01 HISTORY — PX: PARTIAL COLECTOMY: SHX5273

## 2015-03-01 LAB — CBC
HEMATOCRIT: 40.6 % (ref 39.0–52.0)
Hemoglobin: 13.8 g/dL (ref 13.0–17.0)
MCH: 33.7 pg (ref 26.0–34.0)
MCHC: 34 g/dL (ref 30.0–36.0)
MCV: 99.3 fL (ref 78.0–100.0)
PLATELETS: 241 10*3/uL (ref 150–400)
RBC: 4.09 MIL/uL — ABNORMAL LOW (ref 4.22–5.81)
RDW: 12.8 % (ref 11.5–15.5)
WBC: 11.3 10*3/uL — AB (ref 4.0–10.5)

## 2015-03-01 LAB — TYPE AND SCREEN
ABO/RH(D): O POS
Antibody Screen: NEGATIVE

## 2015-03-01 LAB — CREATININE, SERUM: CREATININE: 0.89 mg/dL (ref 0.61–1.24)

## 2015-03-01 SURGERY — COLECTOMY, PARTIAL
Anesthesia: General | Site: Abdomen

## 2015-03-01 MED ORDER — ROCURONIUM BROMIDE 100 MG/10ML IV SOLN
INTRAVENOUS | Status: AC
  Filled 2015-03-01: qty 1

## 2015-03-01 MED ORDER — ONDANSETRON HCL 4 MG/2ML IJ SOLN: 4.0000 mg | Freq: Four times a day (QID) | INTRAMUSCULAR | Status: DC | PRN

## 2015-03-01 MED ORDER — CEFOTETAN DISODIUM 2 G IJ SOLR
2.0000 g | Freq: Two times a day (BID) | INTRAMUSCULAR | Status: AC
  Administered 2015-03-01: 2 g via INTRAVENOUS
  Filled 2015-03-01: qty 2

## 2015-03-01 MED ORDER — DEXAMETHASONE SODIUM PHOSPHATE 10 MG/ML IJ SOLN
INTRAMUSCULAR | Status: DC | PRN
  Administered 2015-03-01: 10 mg via INTRAVENOUS

## 2015-03-01 MED ORDER — DEXAMETHASONE SODIUM PHOSPHATE 10 MG/ML IJ SOLN
INTRAMUSCULAR | Status: AC
  Filled 2015-03-01: qty 1

## 2015-03-01 MED ORDER — HEPARIN SODIUM (PORCINE) 5000 UNIT/ML IJ SOLN
5000.0000 [IU] | Freq: Three times a day (TID) | INTRAMUSCULAR | Status: DC
  Administered 2015-03-01 – 2015-03-05 (×9): 5000 [IU] via SUBCUTANEOUS
  Filled 2015-03-01 (×13): qty 1

## 2015-03-01 MED ORDER — CHLORHEXIDINE GLUCONATE CLOTH 2 % EX PADS: 6.0000 | MEDICATED_PAD | Freq: Once | CUTANEOUS | Status: DC

## 2015-03-01 MED ORDER — PHENYLEPHRINE HCL 10 MG/ML IJ SOLN
INTRAMUSCULAR | Status: DC | PRN
  Administered 2015-03-01 (×3): 80 ug via INTRAVENOUS

## 2015-03-01 MED ORDER — SODIUM CHLORIDE 0.9 % IJ SOLN: 9.0000 mL | INTRAMUSCULAR | Status: DC | PRN

## 2015-03-01 MED ORDER — ONDANSETRON HCL 4 MG/2ML IJ SOLN
INTRAMUSCULAR | Status: DC | PRN
  Administered 2015-03-01: 4 mg via INTRAVENOUS

## 2015-03-01 MED ORDER — CEFOTETAN DISODIUM-DEXTROSE 2-2.08 GM-% IV SOLR
INTRAVENOUS | Status: AC
  Filled 2015-03-01: qty 50

## 2015-03-01 MED ORDER — MIDAZOLAM HCL 5 MG/5ML IJ SOLN
INTRAMUSCULAR | Status: DC | PRN
  Administered 2015-03-01 (×2): 1 mg via INTRAVENOUS

## 2015-03-01 MED ORDER — DIPHENHYDRAMINE HCL 25 MG PO CAPS
25.0000 mg | ORAL_CAPSULE | Freq: Four times a day (QID) | ORAL | Status: DC | PRN
Start: 2015-03-01 — End: 2015-03-05

## 2015-03-01 MED ORDER — HYDROMORPHONE HCL 1 MG/ML IJ SOLN
INTRAMUSCULAR | Status: DC | PRN
  Administered 2015-03-01 (×2): 1 mg via INTRAVENOUS

## 2015-03-01 MED ORDER — ONDANSETRON HCL 4 MG/2ML IJ SOLN
INTRAMUSCULAR | Status: AC
  Filled 2015-03-01: qty 2

## 2015-03-01 MED ORDER — PROMETHAZINE HCL 25 MG/ML IJ SOLN
INTRAMUSCULAR | Status: AC
  Administered 2015-03-01: 12.5 mg via INTRAVENOUS
  Filled 2015-03-01: qty 1

## 2015-03-01 MED ORDER — DIPHENHYDRAMINE HCL 12.5 MG/5ML PO ELIX: 12.5000 mg | ORAL_SOLUTION | Freq: Four times a day (QID) | ORAL | Status: DC | PRN

## 2015-03-01 MED ORDER — LACTATED RINGERS IV SOLN
INTRAVENOUS | Status: DC | PRN
  Administered 2015-03-01 (×4): via INTRAVENOUS

## 2015-03-01 MED ORDER — GLYCOPYRROLATE 0.2 MG/ML IJ SOLN
INTRAMUSCULAR | Status: DC | PRN
  Administered 2015-03-01: 0.4 mg via INTRAVENOUS

## 2015-03-01 MED ORDER — SODIUM CHLORIDE 0.9 % IJ SOLN
INTRAMUSCULAR | Status: AC
  Filled 2015-03-01: qty 10

## 2015-03-01 MED ORDER — GLYCOPYRROLATE 0.2 MG/ML IJ SOLN
INTRAMUSCULAR | Status: AC
  Filled 2015-03-01: qty 1

## 2015-03-01 MED ORDER — HYDROMORPHONE 1 MG/ML IV SOLN
INTRAVENOUS | Status: DC
  Administered 2015-03-01: 22:00:00 via INTRAVENOUS
  Administered 2015-03-02: 3 mg via INTRAVENOUS
  Administered 2015-03-02: 1.8 mg via INTRAVENOUS
  Administered 2015-03-02: 1.53 mg via INTRAVENOUS
  Administered 2015-03-02: 3 mg via INTRAVENOUS
  Filled 2015-03-01: qty 25

## 2015-03-01 MED ORDER — KCL IN DEXTROSE-NACL 20-5-0.45 MEQ/L-%-% IV SOLN
INTRAVENOUS | Status: DC
  Administered 2015-03-01 – 2015-03-05 (×6): via INTRAVENOUS
  Filled 2015-03-01 (×14): qty 1000

## 2015-03-01 MED ORDER — SUFENTANIL CITRATE 50 MCG/ML IV SOLN
INTRAVENOUS | Status: DC | PRN
  Administered 2015-03-01: 10 ug via INTRAVENOUS
  Administered 2015-03-01: 5 ug via INTRAVENOUS
  Administered 2015-03-01: 10 ug via INTRAVENOUS
  Administered 2015-03-01: 15 ug via INTRAVENOUS
  Administered 2015-03-01: 10 ug via INTRAVENOUS

## 2015-03-01 MED ORDER — 0.9 % SODIUM CHLORIDE (POUR BTL) OPTIME
TOPICAL | Status: DC | PRN
  Administered 2015-03-01: 2000 mL

## 2015-03-01 MED ORDER — SODIUM CHLORIDE 0.9 % IJ SOLN
INTRAMUSCULAR | Status: AC
  Filled 2015-03-01: qty 20

## 2015-03-01 MED ORDER — SUFENTANIL CITRATE 50 MCG/ML IV SOLN
INTRAVENOUS | Status: AC
  Filled 2015-03-01: qty 1

## 2015-03-01 MED ORDER — ROCURONIUM BROMIDE 100 MG/10ML IV SOLN
INTRAVENOUS | Status: DC | PRN
  Administered 2015-03-01 (×2): 20 mg via INTRAVENOUS
  Administered 2015-03-01: 60 mg via INTRAVENOUS
  Administered 2015-03-01: 10 mg via INTRAVENOUS

## 2015-03-01 MED ORDER — PHENYLEPHRINE 40 MCG/ML (10ML) SYRINGE FOR IV PUSH (FOR BLOOD PRESSURE SUPPORT)
PREFILLED_SYRINGE | INTRAVENOUS | Status: AC
  Filled 2015-03-01: qty 10

## 2015-03-01 MED ORDER — NEOSTIGMINE METHYLSULFATE 10 MG/10ML IV SOLN
INTRAVENOUS | Status: AC
  Filled 2015-03-01: qty 1

## 2015-03-01 MED ORDER — FENTANYL CITRATE (PF) 100 MCG/2ML IJ SOLN
12.5000 ug | INTRAMUSCULAR | Status: DC | PRN
Start: 2015-03-01 — End: 2015-03-01
  Administered 2015-03-01 (×4): 12.5 ug via INTRAVENOUS
  Filled 2015-03-01 (×4): qty 2

## 2015-03-01 MED ORDER — ONDANSETRON HCL 4 MG PO TABS: 4.0000 mg | ORAL_TABLET | Freq: Four times a day (QID) | ORAL | Status: DC | PRN

## 2015-03-01 MED ORDER — DIPHENHYDRAMINE HCL 50 MG/ML IJ SOLN: 12.5000 mg | Freq: Four times a day (QID) | INTRAMUSCULAR | Status: DC | PRN

## 2015-03-01 MED ORDER — HYDROMORPHONE HCL 2 MG/ML IJ SOLN
INTRAMUSCULAR | Status: AC
  Filled 2015-03-01: qty 1

## 2015-03-01 MED ORDER — SODIUM CHLORIDE 0.9 % IJ SOLN
INTRAMUSCULAR | Status: DC | PRN
  Administered 2015-03-01: 20 mL

## 2015-03-01 MED ORDER — ONDANSETRON HCL 4 MG/2ML IJ SOLN
4.0000 mg | Freq: Four times a day (QID) | INTRAMUSCULAR | Status: DC | PRN
  Administered 2015-03-03 – 2015-03-04 (×3): 4 mg via INTRAVENOUS
  Filled 2015-03-01 (×4): qty 2

## 2015-03-01 MED ORDER — LIDOCAINE HCL (CARDIAC) 20 MG/ML IV SOLN
INTRAVENOUS | Status: AC
  Filled 2015-03-01: qty 5

## 2015-03-01 MED ORDER — NEOSTIGMINE METHYLSULFATE 10 MG/10ML IV SOLN
INTRAVENOUS | Status: DC | PRN
  Administered 2015-03-01: 3 mg via INTRAVENOUS

## 2015-03-01 MED ORDER — LIDOCAINE HCL (CARDIAC) 20 MG/ML IV SOLN
INTRAVENOUS | Status: DC | PRN
  Administered 2015-03-01: 25 mg via INTRATRACHEAL
  Administered 2015-03-01: 75 mg via INTRAVENOUS

## 2015-03-01 MED ORDER — FENTANYL CITRATE (PF) 100 MCG/2ML IJ SOLN
INTRAMUSCULAR | Status: DC | PRN
  Administered 2015-03-01: 50 ug via INTRAVENOUS
  Administered 2015-03-01: 100 ug via INTRAVENOUS
  Administered 2015-03-01 (×2): 50 ug via INTRAVENOUS

## 2015-03-01 MED ORDER — HYDROMORPHONE HCL 1 MG/ML IJ SOLN: 0.5000 mg | INTRAMUSCULAR | Status: DC | PRN

## 2015-03-01 MED ORDER — ONDANSETRON HCL 4 MG/2ML IJ SOLN
4.0000 mg | Freq: Once | INTRAMUSCULAR | Status: DC | PRN
Start: 2015-03-01 — End: 2015-03-01

## 2015-03-01 MED ORDER — NALOXONE HCL 0.4 MG/ML IJ SOLN: 0.4000 mg | INTRAMUSCULAR | Status: DC | PRN

## 2015-03-01 MED ORDER — BUPIVACAINE LIPOSOME 1.3 % IJ SUSP
20.0000 mL | Freq: Once | INTRAMUSCULAR | Status: AC
  Administered 2015-03-01: 20 mL
  Filled 2015-03-01: qty 20

## 2015-03-01 MED ORDER — PROPOFOL 10 MG/ML IV BOLUS
INTRAVENOUS | Status: DC | PRN
  Administered 2015-03-01: 180 mg via INTRAVENOUS

## 2015-03-01 MED ORDER — PROPOFOL 10 MG/ML IV BOLUS
INTRAVENOUS | Status: AC
  Filled 2015-03-01: qty 20

## 2015-03-01 MED ORDER — LACTATED RINGERS IR SOLN
Status: DC | PRN
  Administered 2015-03-01: 1000 mL

## 2015-03-01 MED ORDER — DEXTROSE 5 % IV SOLN: 2.0000 g | INTRAVENOUS | Status: DC

## 2015-03-01 MED ORDER — CEFOTETAN DISODIUM-DEXTROSE 2-2.08 GM-% IV SOLR
2.0000 g | INTRAVENOUS | Status: AC
  Administered 2015-03-01: 2 g via INTRAVENOUS

## 2015-03-01 MED ORDER — PROMETHAZINE HCL 25 MG/ML IJ SOLN
12.5000 mg | Freq: Four times a day (QID) | INTRAMUSCULAR | Status: DC | PRN
  Administered 2015-03-01: 12.5 mg via INTRAVENOUS

## 2015-03-01 MED ORDER — MIDAZOLAM HCL 2 MG/2ML IJ SOLN
INTRAMUSCULAR | Status: AC
  Filled 2015-03-01: qty 2

## 2015-03-01 MED ORDER — DIPHENHYDRAMINE HCL 50 MG/ML IJ SOLN
25.0000 mg | Freq: Four times a day (QID) | INTRAMUSCULAR | Status: DC | PRN
Start: 2015-03-01 — End: 2015-03-05

## 2015-03-01 MED ORDER — FENTANYL CITRATE (PF) 250 MCG/5ML IJ SOLN
INTRAMUSCULAR | Status: AC
  Filled 2015-03-01: qty 5

## 2015-03-01 SURGICAL SUPPLY — 61 items
APPLICATOR COTTON TIP 6IN STRL (MISCELLANEOUS) ×6 IMPLANT
BLADE EXTENDED COATED 6.5IN (ELECTRODE) IMPLANT
BLADE HEX COATED 2.75 (ELECTRODE) ×3 IMPLANT
BLADE SURG SZ10 CARB STEEL (BLADE) IMPLANT
CELLS DAT CNTRL 66122 CELL SVR (MISCELLANEOUS) ×1 IMPLANT
CLIP TI LARGE 6 (CLIP) IMPLANT
COUNTER NEEDLE 20 DBL MAG RED (NEEDLE) ×3 IMPLANT
COVER MAYO STAND STRL (DRAPES) ×3 IMPLANT
COVER SURGICAL LIGHT HANDLE (MISCELLANEOUS) ×3 IMPLANT
DECANTER SPIKE VIAL GLASS SM (MISCELLANEOUS) ×3 IMPLANT
DRAPE LAPAROSCOPIC ABDOMINAL (DRAPES) ×3 IMPLANT
DRAPE SHEET LG 3/4 BI-LAMINATE (DRAPES) IMPLANT
DRAPE WARM FLUID 44X44 (DRAPE) ×3 IMPLANT
DRSG OPSITE POSTOP 4X6 (GAUZE/BANDAGES/DRESSINGS) ×3 IMPLANT
DRSG PAD ABDOMINAL 8X10 ST (GAUZE/BANDAGES/DRESSINGS) IMPLANT
ELECT CAUTERY BLADE 6.4 (BLADE) ×3 IMPLANT
ELECT REM PT RETURN 9FT ADLT (ELECTROSURGICAL) ×3
ELECTRODE REM PT RTRN 9FT ADLT (ELECTROSURGICAL) ×1 IMPLANT
ENSEAL DEVICE STD TIP 35CM (ENDOMECHANICALS) ×3 IMPLANT
GAUZE SPONGE 2X2 8PLY STRL LF (GAUZE/BANDAGES/DRESSINGS) ×1 IMPLANT
GAUZE SPONGE 4X4 12PLY STRL (GAUZE/BANDAGES/DRESSINGS) ×3 IMPLANT
GLOVE BIOGEL PI IND STRL 7.0 (GLOVE) ×1 IMPLANT
GLOVE BIOGEL PI INDICATOR 7.0 (GLOVE) ×2
GLOVE ECLIPSE 8.0 STRL XLNG CF (GLOVE) ×3 IMPLANT
GOWN SPEC L4 XLG W/TWL (GOWN DISPOSABLE) ×3 IMPLANT
GOWN STRL REUS W/TWL LRG LVL3 (GOWN DISPOSABLE) ×3 IMPLANT
GOWN STRL REUS W/TWL XL LVL3 (GOWN DISPOSABLE) ×9 IMPLANT
HANDLE SUCTION POOLE (INSTRUMENTS) ×1 IMPLANT
KIT BASIN OR (CUSTOM PROCEDURE TRAY) ×3 IMPLANT
LEGGING LITHOTOMY PAIR STRL (DRAPES) ×3 IMPLANT
LIGASURE IMPACT 36 18CM CVD LR (INSTRUMENTS) ×3 IMPLANT
NS IRRIG 1000ML POUR BTL (IV SOLUTION) ×6 IMPLANT
PACK COLON (CUSTOM PROCEDURE TRAY) ×3 IMPLANT
RELOAD PROXIMATE 75MM BLUE (ENDOMECHANICALS) ×3 IMPLANT
RTRCTR WOUND ALEXIS 18CM MED (MISCELLANEOUS) ×3
SHEARS HARMONIC ACE PLUS 36CM (ENDOMECHANICALS) IMPLANT
SLEEVE XCEL OPT CAN 5 100 (ENDOMECHANICALS) ×3 IMPLANT
SPONGE GAUZE 2X2 STER 10/PKG (GAUZE/BANDAGES/DRESSINGS) ×2
STAPLER PROXIMATE 75MM BLUE (STAPLE) ×6 IMPLANT
STAPLER VISISTAT 35W (STAPLE) ×3 IMPLANT
SUCTION POOLE HANDLE (INSTRUMENTS) ×3
SUT NOV 1 T60/GS (SUTURE) IMPLANT
SUT NOVA 1 T20/GS 25DT (SUTURE) ×6 IMPLANT
SUT NOVA NAB DX-16 0-1 5-0 T12 (SUTURE) IMPLANT
SUT NOVA T20/GS 25 (SUTURE) IMPLANT
SUT PDS AB 1 TP1 96 (SUTURE) ×6 IMPLANT
SUT PDS AB 4-0 SH 27 (SUTURE) ×6 IMPLANT
SUT SILK 2 0 (SUTURE) ×2
SUT SILK 2 0 SH CR/8 (SUTURE) ×3 IMPLANT
SUT SILK 2 0SH CR/8 30 (SUTURE) IMPLANT
SUT SILK 2-0 18XBRD TIE 12 (SUTURE) ×1 IMPLANT
SUT SILK 2-0 30XBRD TIE 12 (SUTURE) IMPLANT
SUT SILK 3 0 (SUTURE) ×4
SUT SILK 3 0 SH CR/8 (SUTURE) ×9 IMPLANT
SUT SILK 3-0 18XBRD TIE 12 (SUTURE) ×2 IMPLANT
SUT VIC AB 0 CT1 36 (SUTURE) ×3 IMPLANT
SYR 20CC LL (SYRINGE) ×3 IMPLANT
TOWEL OR 17X26 10 PK STRL BLUE (TOWEL DISPOSABLE) ×6 IMPLANT
TRAY FOLEY W/METER SILVER 16FR (SET/KITS/TRAYS/PACK) ×3 IMPLANT
WATER STERILE IRR 1500ML POUR (IV SOLUTION) IMPLANT
YANKAUER SUCT BULB TIP NO VENT (SUCTIONS) ×3 IMPLANT

## 2015-03-01 NOTE — Progress Notes (Signed)
Patient states he has had a recent  Cold but is feeling much better now. Has been taking Nyquil

## 2015-03-01 NOTE — Interval H&P Note (Signed)
History and Physical Interval Note:  03/01/2015 9:44 AM  [name]  has presented today for surgery, with the diagnosis of COLOCUTANEOUS FISTULA  The various methods of treatment have been discussed with the patient and family. After consideration of risks, benefits and other options for treatment, the patient has consented to  Procedure(s): PARTIAL COLECTOMY (N/A) as a surgical intervention .  The patient's history has been reviewed, patient examined, no change in status, stable for surgery.  I have reviewed the patient's chart and labs.  Questions were answered to the patient's satisfaction.     Andreea Arca B

## 2015-03-01 NOTE — Anesthesia Procedure Notes (Signed)
Procedure Name: Intubation Date/Time: 03/01/2015 10:21 AM Performed by: Illene SilverEVANS, Roberto Romanoski E Pre-anesthesia Checklist: Patient identified, Emergency Drugs available, Suction available and Patient being monitored Patient Re-evaluated:Patient Re-evaluated prior to inductionOxygen Delivery Method: Circle System Utilized Preoxygenation: Pre-oxygenation with 100% oxygen Intubation Type: IV induction Ventilation: Mask ventilation without difficulty Laryngoscope Size: Mac and 4 Tube type: Oral Tube size: 7.5 mm Number of attempts: 1 Airway Equipment and Method: Stylet and Oral airway Placement Confirmation: ETT inserted through vocal cords under direct vision,  positive ETCO2 and breath sounds checked- equal and bilateral Secured at: 21 cm Tube secured with: Tape Dental Injury: Teeth and Oropharynx as per pre-operative assessment

## 2015-03-01 NOTE — Anesthesia Postprocedure Evaluation (Signed)
Anesthesia Post Note  Patient: [name]  Procedure(s) Performed: Procedure(s) (LRB): LAPRASCOPIC ASSITED PARTIAL COLECTOMY (N/A)  Patient location during evaluation: PACU Anesthesia Type: General Level of consciousness: awake, awake and alert, oriented and patient cooperative Pain management: pain level controlled Vital Signs Assessment: post-procedure vital signs reviewed and stable Respiratory status: spontaneous breathing Cardiovascular status: blood pressure returned to baseline and stable Anesthetic complications: no    Last Vitals:  Filed Vitals:   03/01/15 1323 03/01/15 1330  BP: 126/80 131/86  Pulse: 80 84  Temp:    Resp: 9 10    Last Pain:  Filed Vitals:   03/01/15 1352  PainSc: 0-No pain                 Eleftheria Taborn EDWARD

## 2015-03-01 NOTE — Brief Op Note (Signed)
03/01/2015  1:18 PM  PATIENT:  Athena MasseMichael Olenick  59 y.o. male  PRE-OPERATIVE DIAGNOSIS:  COLOCUTANEOUS FISTULA  POST-OPERATIVE DIAGNOSIS:  COLOCUTANEOUS FISTULA  PROCEDURE:  Procedure(s): LAPRASCOPIC ASSITED PARTIAL COLECTOMY (N/A)  SURGEON:  Surgeon(s) and Role:    * Glenna FellowsBenjamin Hoxworth, MD    * Luretha MurphyMatthew Meliyah Simon, MD - Primary  PHYSICIAN ASSISTANT:   ASSISTANTS: Jaclynn GuarneriBen Hoxworth, MD, FACS   ANESTHESIA:   general  EBL:  Total I/O In: 3000 [I.V.:3000] Out: 265 [Urine:65; Blood:200]  BLOOD ADMINISTERED:none  DRAINS: none   LOCAL MEDICATIONS USED:  BUPIVICAINE   SPECIMEN:  Source of Specimen:  sigmoid colon   DISPOSITION OF SPECIMEN:  PATHOLOGY  COUNTS:  YES  TOURNIQUET:  * No tourniquets in log *  DICTATION: .Other Dictation: Dictation Number [id number]  PLAN OF CARE: Admit to inpatient   PATIENT DISPOSITION:  PACU - hemodynamically stable.   Delay start of Pharmacological VTE agent (>24hrs) due to surgical blood loss or risk of bleeding: no

## 2015-03-01 NOTE — Progress Notes (Signed)
PHARMACY NOTE -  ANTIBIOTIC RENAL DOSE ADJUSTMENT    Request received for Pharmacy to assist with antibiotic renal dose adjustment.   Patient has been initiated on Cefotetan for Surgical Prophylaxis.  SCr 0.96, estimated CrCl 84 ml/min  Current dosage is appropriate and need for further dosage adjustment appears unlikely at present.  Will sign off at this time.  Please reconsult if a change in clinical status warrants re-evaluation of dosage.  Bernadene Personrew Keili Hasten, PharmD, BCPS Pager: [phone] 03/01/2015, 7:59 AM

## 2015-03-01 NOTE — Transfer of Care (Signed)
Immediate Anesthesia Transfer of Care Note  Patient: Alejandro MasseMichael [name]  Procedure(s) Performed: Procedure(s): LAPRASCOPIC ASSITED PARTIAL COLECTOMY (N/A)  Patient Location: PACU  Anesthesia Type:General  Level of Consciousness: awake, alert , oriented and patient cooperative  Airway & Oxygen Therapy: Patient Spontanous Breathing and Patient connected to face mask oxygen  Post-op Assessment: Report given to RN, Post -op Vital signs reviewed and stable and Patient moving all extremities X 4  Post vital signs: stable  Last Vitals:  Filed Vitals:   03/01/15 0808  BP: 115/80  Pulse: 84  Temp: 36.8 C  Resp: 18    Complications: No apparent anesthesia complications

## 2015-03-01 NOTE — Addendum Note (Signed)
Addendum  created 03/01/15 1706 by Illene SilverJanet E Bernardo Brayman, CRNA   Modules edited: Anesthesia Events, Narrator   Narrator:  Narrator: Event Log Edited

## 2015-03-01 NOTE — Anesthesia Preprocedure Evaluation (Addendum)
Anesthesia Evaluation  Patient identified by MRN, date of birth, ID band Patient awake    Reviewed: Allergy & Precautions, NPO status , Patient's Chart, lab work & pertinent test results  Airway Mallampati: I       Dental   Pulmonary    Pulmonary exam normal        Cardiovascular Normal cardiovascular exam Rhythm:Regular Rate:Normal     Neuro/Psych    GI/Hepatic GERD  ,  Endo/Other    Renal/GU      Musculoskeletal   Abdominal   Peds  Hematology   Anesthesia Other Findings   Reproductive/Obstetrics                             Anesthesia Physical Anesthesia Plan  ASA: I  Anesthesia Plan: General   Post-op Pain Management:    Induction: Intravenous  Airway Management Planned: Oral ETT  Additional Equipment:   Intra-op Plan:   Post-operative Plan: Extubation in OR  Informed Consent: I have reviewed the patients History and Physical, chart, labs and discussed the procedure including the risks, benefits and alternatives for the proposed anesthesia with the patient or authorized representative who has indicated his/her understanding and acceptance.     Plan Discussed with: CRNA, Anesthesiologist and Surgeon  Anesthesia Plan Comments:         Anesthesia Quick Evaluation

## 2015-03-02 ENCOUNTER — Encounter (HOSPITAL_COMMUNITY): Payer: Self-pay | Admitting: Surgery

## 2015-03-02 LAB — CBC
HEMATOCRIT: 37.5 % — AB (ref 39.0–52.0)
Hemoglobin: 12.7 g/dL — ABNORMAL LOW (ref 13.0–17.0)
MCH: 33.2 pg (ref 26.0–34.0)
MCHC: 33.9 g/dL (ref 30.0–36.0)
MCV: 98.2 fL (ref 78.0–100.0)
Platelets: 228 10*3/uL (ref 150–400)
RBC: 3.82 MIL/uL — ABNORMAL LOW (ref 4.22–5.81)
RDW: 12.7 % (ref 11.5–15.5)
WBC: 8.3 10*3/uL (ref 4.0–10.5)

## 2015-03-02 LAB — BASIC METABOLIC PANEL
ANION GAP: 7 (ref 5–15)
BUN: 8 mg/dL (ref 6–20)
CALCIUM: 8.6 mg/dL — AB (ref 8.9–10.3)
CO2: 27 mmol/L (ref 22–32)
CREATININE: 0.81 mg/dL (ref 0.61–1.24)
Chloride: 105 mmol/L (ref 101–111)
GFR calc Af Amer: >60 mL/min (ref >60)
GFR calc non Af Amer: >60 mL/min (ref >60)
GLUCOSE: 123 mg/dL — AB (ref 65–99)
Potassium: 4.1 mmol/L (ref 3.5–5.1)
Sodium: 139 mmol/L (ref 135–145)

## 2015-03-02 MED ORDER — HYDROMORPHONE HCL 1 MG/ML IJ SOLN
0.5000 mg | INTRAMUSCULAR | Status: DC | PRN
  Administered 2015-03-02 (×3): 2 mg via INTRAVENOUS
  Administered 2015-03-02: 1 mg via INTRAVENOUS
  Administered 2015-03-03 (×7): 2 mg via INTRAVENOUS
  Administered 2015-03-04: 1 mg via INTRAVENOUS
  Administered 2015-03-04 (×2): 2 mg via INTRAVENOUS
  Administered 2015-03-04 (×3): 1 mg via INTRAVENOUS
  Filled 2015-03-02 (×2): qty 2
  Filled 2015-03-02 (×2): qty 1
  Filled 2015-03-02 (×7): qty 2
  Filled 2015-03-02: qty 1
  Filled 2015-03-02: qty 2
  Filled 2015-03-02: qty 1
  Filled 2015-03-02: qty 2
  Filled 2015-03-02: qty 1
  Filled 2015-03-02 (×2): qty 2

## 2015-03-02 MED ORDER — PANTOPRAZOLE SODIUM 40 MG IV SOLR
40.0000 mg | INTRAVENOUS | Status: DC
  Administered 2015-03-02 – 2015-03-04 (×3): 40 mg via INTRAVENOUS
  Filled 2015-03-02 (×3): qty 40

## 2015-03-02 NOTE — Op Note (Signed)
[name]:  [name], [name]              ACCOUNT NO.:  [id number]  MEDICAL RECORD NO.:  [id number]  LOCATION:  1526                         [facility]  PHYSICIAN:  Alejandro ParkMatthew B. Daphine DeutscherMartin, MD  DATE OF BIRTH:  [birth year]  DATE OF PROCEDURE: DATE OF DISCHARGE:                              OPERATIVE REPORT   PREOPERATIVE DIAGNOSIS:  Three months out from perforated diverticulitis with peritonitis and colocutaneous fistula.  PROCEDURES:  Laparoscopic enterolysis and laparoscopically-assisted sigmoid colectomy with primary hand-sewn end-to-end anastomosis.  SURGEON:  Alejandro ParkMatthew B. Daphine DeutscherMartin, MD  ASSISTANT:  [name], M.D.  ANESTHESIA:  General endotracheal.  DESCRIPTION OF PROCEDURE:  The patient was taken to room #1 and placed in the stirrups and after anesthesia, was prepped with the chlorhexidine, alcohol prep as per the colon protocol.  He was draped sterilely.  Time-out was performed.  Entered the abdomen again through the right upper quadrant using 5-mm Optiview.  With that, I could see that there were at least three small bowel loops that were stuck straight up to the anterior abdominal wall and then, there was then an areas of the colon that was stuck up as well.  I placed a second port laterally and then one in the midline and with those, used scissors in countertraction to lyse these adhesions.  There was a loop of small bowel stuck right up to the umbilicus and within, it was a little round, firm pustule, which we sucked up and removed.  Once all these were freed, I went ahead and mobilized the left colon up toward the spleen and then came down and swept the colon medially and was not able to get it freed up down to the pelvis.  At that point, I made a lower midline incision about 9 cm, opened and then first order of business was to run the small bowel.  I freed up multiple adhesions and I checked the areas that involved about from these lapses.  There were no  enterotomies made.  The bowel seemed to be intact.  We ran all the way back to the ligament of Treitz and also to the terminal ileum.  Thankfully so because there were at least three areas where there were tight bands that would have ultimately produced obstructions given enough time.  These were all lysed.  I then mobilized the colon and found the area of perforation and the midportion of the area had resected.  I went down to the confluence of tenia and then divided it with a GIA-75.  I went approximately with the ligature through the mesentery and then divided the bowel proximally well above the area of inflammation.  I then did a hand-sewn end-to-end anastomosis.  I did this by first putting a back row of 3-0 silks.  I cut both staple lines and an inner-layer of running locking 4-0 PDS and an outer-layer of Lembert sutures of 3-0 silk.  A good anastomosis was felt to be present.  Bleeding had been controlled.  I irrigated with saline with 2 liters of saline.  There was no bleeding or enterotomy- looking fluid.  Everything looked good.  We then changed gown and gloves and followed the colon  protocol.  I closed the perineum with running 2-0 Vicryl.  The fascia was closed with interrupted #1 Novafil and the wound was again irrigated.  It was infiltrated with 40 mL of diluted Exparel.  It was then closed with staples.  The patient tolerated the procedure well and was taken to the recovery room in satisfactory condition.     Alejandro Park Daphine Deutscher, MD     MBM/MEDQ  D:  03/01/2015  T:  03/02/2015  Job:  [id number]

## 2015-03-02 NOTE — Progress Notes (Signed)
Dr. Daphine DeutscherMartin made aware via phone pt c/o of heartburn. Pt stated " I have my Tagamet from home. Can I take that." Pt's daughter advised to take any home medications at bedside home per hospital policy. Pt and daughter verbalized understanding. Pt reminded of NPO status except for ice chips only. Order received for IV Protonix from Dr. Daphine DeutscherMartin.

## 2015-03-02 NOTE — Progress Notes (Signed)
Patient ID: [name], male   DOB: [birth year], 59 y.o.   MRN: [id number] [facility] Progress Note:   1 Day Post-Op  Subjective: Mental status is clear.  Wants PCA discontinued and start IV pain meds Objective: Vital signs in last 24 hours: Temp:  [97.8 F (36.6 C)-98.7 F (37.1 C)] 97.8 F (36.6 C) (12/20 1000) Pulse Rate:  [65-87] 65 (12/20 1000) Resp:  [10-100] 11 (12/20 1201) BP: (101-122)/(63-74) 118/63 mmHg (12/20 1000) SpO2:  [2 %-100 %] 96 % (12/20 1201) FiO2 (%):  [32 %-34 %] 34 % (12/20 0400)  Intake/Output from previous day: 12/19 0701 - 12/20 0700 In: 5485.4 [I.V.:5485.4] Out: 7902 [Urine:3045; Blood:200] Intake/Output this shift: Total I/O In: -  Out: 50 [Urine:50]  Physical Exam: Work of breathing is normal.  Incisions covered.  Lab Results:  Results for orders placed or performed during the hospital encounter of 03/01/15 (from the past 48 hour(s))  CBC     Status: Abnormal   Collection Time: 03/01/15  3:13 PM  Result Value Ref Range   WBC 11.3 (H) 4.0 - 10.5 K/uL   RBC 4.09 (L) 4.22 - 5.81 MIL/uL   Hemoglobin 13.8 13.0 - 17.0 g/dL   HCT 40.6 39.0 - 52.0 %   MCV 99.3 78.0 - 100.0 fL   MCH 33.7 26.0 - 34.0 pg   MCHC 34.0 30.0 - 36.0 g/dL   RDW 12.8 11.5 - 15.5 %   Platelets 241 150 - 400 K/uL  Creatinine, serum     Status: None   Collection Time: 03/01/15  3:13 PM  Result Value Ref Range   Creatinine, Ser 0.89 0.61 - 1.24 mg/dL   GFR calc non Af Amer >60 >60 mL/min   GFR calc Af Amer >60 >60 mL/min    Comment: (NOTE) The eGFR has been calculated using the CKD EPI equation. This calculation has not been validated in all clinical situations. eGFR's persistently <60 mL/min signify possible Chronic Kidney Disease.   Basic metabolic panel     Status: Abnormal   Collection Time: 03/02/15  4:35 AM  Result Value Ref Range   Sodium 139 135 - 145 mmol/L   Potassium 4.1 3.5 - 5.1 mmol/L   Chloride 105 101 - 111 mmol/L   CO2 27 22 - 32  mmol/L   Glucose, Bld 123 (H) 65 - 99 mg/dL   BUN 8 6 - 20 mg/dL   Creatinine, Ser 0.81 0.61 - 1.24 mg/dL   Calcium 8.6 (L) 8.9 - 10.3 mg/dL   GFR calc non Af Amer >60 >60 mL/min   GFR calc Af Amer >60 >60 mL/min    Comment: (NOTE) The eGFR has been calculated using the CKD EPI equation. This calculation has not been validated in all clinical situations. eGFR's persistently <60 mL/min signify possible Chronic Kidney Disease.    Anion gap 7 5 - 15  CBC     Status: Abnormal   Collection Time: 03/02/15  4:35 AM  Result Value Ref Range   WBC 8.3 4.0 - 10.5 K/uL   RBC 3.82 (L) 4.22 - 5.81 MIL/uL   Hemoglobin 12.7 (L) 13.0 - 17.0 g/dL   HCT 37.5 (L) 39.0 - 52.0 %   MCV 98.2 78.0 - 100.0 fL   MCH 33.2 26.0 - 34.0 pg   MCHC 33.9 30.0 - 36.0 g/dL   RDW 12.7 11.5 - 15.5 %   Platelets 228 150 - 400 K/uL    Radiology/Results: No results found.  Anti-infectives: Anti-infectives  Start     Dose/Rate Route Frequency Ordered Stop   03/01/15 1800  cefoTEtan (CEFOTAN) 2 g in dextrose 5 % 50 mL IVPB     2 g 100 mL/hr over 30 Minutes Intravenous Every 12 hours 03/01/15 1435 03/01/15 1918   03/01/15 0800  cefoTEtan in Dextrose 5% (CEFOTAN) IVPB 2 g     2 g Intravenous On call to O.R. 03/01/15 0746 03/01/15 1040   03/01/15 0741  cefoTEtan (CEFOTAN) 2 g in dextrose 5 % 50 mL IVPB  Status:  Discontinued     2 g 100 mL/hr over 30 Minutes Intravenous On call to O.R. 03/01/15 0741 03/01/15 0746      Assessment/Plan: Problem List: Patient Active Problem List   Diagnosis Date Noted  . Diverticulitis of colon 03/01/2015  . Abscess of male pelvis ([place])   . Abdominal abscess ([place])   . BPH (benign prostatic hyperplasia) 11/24/2014  . Hematuria 11/24/2014  . Proteinuria 11/24/2014  . Cholelithiasis 11/24/2014  . Diverticulitis 11/18/2014    Will discontinue PCA and go to periodic Dilaudid.   1 Day Post-Op    LOS: 1 day   Matt B. Hassell Done, MD, [facility] [phone] beeper [phone]  03/02/2015 2:36 PM

## 2015-03-03 MED ORDER — PROMETHAZINE HCL 25 MG/ML IJ SOLN
12.5000 mg | Freq: Four times a day (QID) | INTRAMUSCULAR | Status: DC | PRN
  Administered 2015-03-03 – 2015-03-04 (×2): 12.5 mg via INTRAVENOUS
  Filled 2015-03-03 (×3): qty 1

## 2015-03-03 NOTE — Progress Notes (Signed)
Patient ID: [name], male   DOB: Feb 18, 1956, 59 y.o.   MRN: [id number] [facility] Surgery Progress Note:   2 Days Post-Op  Subjective: Mental status is clear.  Did better off the PCA Dilaudid Objective: Vital signs in last 24 hours: Temp:  [97.7 F (36.5 C)-98.4 F (36.9 C)] 98.4 F (36.9 C) (12/21 0602) Pulse Rate:  [65-83] 83 (12/21 0602) Resp:  [11-14] 14 (12/21 0602) BP: (105-118)/(63-70) 109/65 mmHg (12/21 0602) SpO2:  [96 %-100 %] 100 % (12/21 0602)  Intake/Output from previous day: 12/20 0701 - 12/21 0700 In: 3000 [I.V.:3000] Out: 850 [Urine:850] Intake/Output this shift:    Physical Exam: Work of breathing is normal.  Nausea and vomiting this morning  Lab Results:  Results for orders placed or performed during the hospital encounter of 03/01/15 (from the past 48 hour(s))  CBC     Status: Abnormal   Collection Time: 03/01/15  3:13 PM  Result Value Ref Range   WBC 11.3 (H) 4.0 - 10.5 K/uL   RBC 4.09 (L) 4.22 - 5.81 MIL/uL   Hemoglobin 13.8 13.0 - 17.0 g/dL   HCT 40.6 39.0 - 52.0 %   MCV 99.3 78.0 - 100.0 fL   MCH 33.7 26.0 - 34.0 pg   MCHC 34.0 30.0 - 36.0 g/dL   RDW 12.8 11.5 - 15.5 %   Platelets 241 150 - 400 K/uL  Creatinine, serum     Status: None   Collection Time: 03/01/15  3:13 PM  Result Value Ref Range   Creatinine, Ser 0.89 0.61 - 1.24 mg/dL   GFR calc non Af Amer >60 >60 mL/min   GFR calc Af Amer >60 >60 mL/min    Comment: (NOTE) The eGFR has been calculated using the CKD EPI equation. This calculation has not been validated in all clinical situations. eGFR's persistently <60 mL/min signify possible Chronic Kidney Disease.   Basic metabolic panel     Status: Abnormal   Collection Time: 03/02/15  4:35 AM  Result Value Ref Range   Sodium 139 135 - 145 mmol/L   Potassium 4.1 3.5 - 5.1 mmol/L   Chloride 105 101 - 111 mmol/L   CO2 27 22 - 32 mmol/L   Glucose, Bld 123 (H) 65 - 99 mg/dL   BUN 8 6 - 20 mg/dL   Creatinine, Ser 0.81 0.61 -  1.24 mg/dL   Calcium 8.6 (L) 8.9 - 10.3 mg/dL   GFR calc non Af Amer >60 >60 mL/min   GFR calc Af Amer >60 >60 mL/min    Comment: (NOTE) The eGFR has been calculated using the CKD EPI equation. This calculation has not been validated in all clinical situations. eGFR's persistently <60 mL/min signify possible Chronic Kidney Disease.    Anion gap 7 5 - 15  CBC     Status: Abnormal   Collection Time: 03/02/15  4:35 AM  Result Value Ref Range   WBC 8.3 4.0 - 10.5 K/uL   RBC 3.82 (L) 4.22 - 5.81 MIL/uL   Hemoglobin 12.7 (L) 13.0 - 17.0 g/dL   HCT 37.5 (L) 39.0 - 52.0 %   MCV 98.2 78.0 - 100.0 fL   MCH 33.2 26.0 - 34.0 pg   MCHC 33.9 30.0 - 36.0 g/dL   RDW 12.7 11.5 - 15.5 %   Platelets 228 150 - 400 K/uL    Radiology/Results: No results found.  Anti-infectives: Anti-infectives    Start     Dose/Rate Route Frequency Ordered Stop   03/01/15  1800  cefoTEtan (CEFOTAN) 2 g in dextrose 5 % 50 mL IVPB     2 g 100 mL/hr over 30 Minutes Intravenous Every 12 hours 03/01/15 1435 03/01/15 1918   03/01/15 0800  cefoTEtan in Dextrose 5% (CEFOTAN) IVPB 2 g     2 g Intravenous On call to O.R. 03/01/15 0746 03/01/15 1040   03/01/15 0741  cefoTEtan (CEFOTAN) 2 g in dextrose 5 % 50 mL IVPB  Status:  Discontinued     2 g 100 mL/hr over 30 Minutes Intravenous On call to O.R. 03/01/15 0741 03/01/15 0746      Assessment/Plan: Problem List: Patient Active Problem List   Diagnosis Date Noted  . Diverticulitis of colon 03/01/2015  . Abscess of male pelvis ([place])   . Abdominal abscess ([place])   . BPH (benign prostatic hyperplasia) 11/24/2014  . Hematuria 11/24/2014  . Proteinuria 11/24/2014  . Cholelithiasis 11/24/2014  . Diverticulitis 11/18/2014    Will try Phenergan to treat hiccoughs.   2 Days Post-Op    LOS: 2 days   Matt B. Hassell Done, MD, [facility] [phone] beeper [phone]  03/03/2015 9:32 AM

## 2015-03-04 MED ORDER — HYDROCODONE-ACETAMINOPHEN 5-325 MG PO TABS
1.0000 | ORAL_TABLET | ORAL | Status: DC | PRN
  Administered 2015-03-04 – 2015-03-05 (×4): 2 via ORAL
  Filled 2015-03-04 (×4): qty 2

## 2015-03-04 NOTE — Progress Notes (Signed)
Report called to Enriqueta ShutterKimberly Perika RN, 3 west.  Patient to shower and eat lunch before transfer.  Staff aware.

## 2015-03-04 NOTE — Progress Notes (Signed)
Patient ID: [name], male   DOB: 12-25-1955, 59 y.o.   MRN: [id number] [facility] Progress [facility]:   3 Days Post-Op  Subjective: Mental status is clear.  Up walking around 5W.  Passing flatus and small BM Objective: Vital signs in last 24 hours: Temp:  [98.2 F (36.8 C)-98.6 F (37 C)] 98.6 F (37 C) (12/22 0602) Pulse Rate:  [76-99] 76 (12/22 0602) Resp:  [16-18] 18 (12/22 0602) BP: (110-112)/(63-84) 111/63 mmHg (12/22 0602) SpO2:  [97 %] 97 % (12/22 0602)  Intake/Output from previous day: 12/21 0701 - 12/22 0700 In: 1500 [I.V.:1500] Out: 350 [Urine:350] Intake/Output this shift:    Physical Exam: Work of breathing is not labored.  Incisions covered  Lab Results:  No results found for this or any previous visit (from the past 48 hour(s)).  Radiology/Results: No results found.  Anti-infectives: Anti-infectives    Start     Dose/Rate Route Frequency Ordered Stop   03/01/15 1800  cefoTEtan (CEFOTAN) 2 g in dextrose 5 % 50 mL IVPB     2 g 100 mL/hr over 30 Minutes Intravenous Every 12 hours 03/01/15 1435 03/01/15 1918   03/01/15 0800  cefoTEtan in Dextrose 5% (CEFOTAN) IVPB 2 g     2 g Intravenous On call to O.R. 03/01/15 0746 03/01/15 1040   03/01/15 0741  cefoTEtan (CEFOTAN) 2 g in dextrose 5 % 50 mL IVPB  Status:  Discontinued     2 g 100 mL/hr over 30 Minutes Intravenous On call to O.R. 03/01/15 0741 03/01/15 0746      Assessment/Plan: Problem List: Patient Active Problem List   Diagnosis Date Noted  . Diverticulitis of colon 03/01/2015  . Abscess of male pelvis ([facility])   . Abdominal abscess ([facility])   . BPH (benign prostatic hyperplasia) 11/24/2014  . Hematuria 11/24/2014  . Proteinuria 11/24/2014  . Cholelithiasis 11/24/2014  . Diverticulitis 11/18/2014    Tolerating clears.  Advance to full.  Check lab in am.   3 Days Post-Op    LOS: 3 days   Matt B. Daphine DeutscherMartin, MD, [facility] [phone]  beeper [phone]  03/04/2015 8:41 AM

## 2015-03-05 LAB — CBC WITH DIFFERENTIAL/PLATELET
BASOS ABS: 0 10*3/uL (ref 0.0–0.1)
BASOS PCT: 1 %
EOS PCT: 5 %
Eosinophils Absolute: 0.2 10*3/uL (ref 0.0–0.7)
HEMATOCRIT: 37.5 % — AB (ref 39.0–52.0)
Hemoglobin: 12.8 g/dL — ABNORMAL LOW (ref 13.0–17.0)
Lymphocytes Relative: 37 %
Lymphs Abs: 1.5 10*3/uL (ref 0.7–4.0)
MCH: 33.4 pg (ref 26.0–34.0)
MCHC: 34.1 g/dL (ref 30.0–36.0)
MCV: 97.9 fL (ref 78.0–100.0)
MONO ABS: 0.5 10*3/uL (ref 0.1–1.0)
Monocytes Relative: 11 %
Neutro Abs: 1.9 10*3/uL (ref 1.7–7.7)
Neutrophils Relative %: 46 %
Platelets: 226 10*3/uL (ref 150–400)
RBC: 3.83 MIL/uL — AB (ref 4.22–5.81)
RDW: 12.2 % (ref 11.5–15.5)
WBC: 4.2 10*3/uL (ref 4.0–10.5)

## 2015-03-05 LAB — BASIC METABOLIC PANEL
ANION GAP: 6 (ref 5–15)
BUN: 7 mg/dL (ref 6–20)
CO2: 31 mmol/L (ref 22–32)
Calcium: 9 mg/dL (ref 8.9–10.3)
Chloride: 101 mmol/L (ref 101–111)
Creatinine, Ser: 0.94 mg/dL (ref 0.61–1.24)
GLUCOSE: 109 mg/dL — AB (ref 65–99)
POTASSIUM: 3.8 mmol/L (ref 3.5–5.1)
Sodium: 138 mmol/L (ref 135–145)

## 2015-03-05 MED ORDER — HYDROCODONE-ACETAMINOPHEN 5-325 MG PO TABS: 1.0000 | ORAL_TABLET | Freq: Four times a day (QID) | ORAL | Status: DC | PRN

## 2015-03-05 MED ORDER — HYDROCODONE-ACETAMINOPHEN 5-325 MG PO TABS: 1.0000 | ORAL_TABLET | ORAL | Status: DC | PRN

## 2015-03-05 NOTE — Discharge Summary (Signed)
Physician Discharge Summary  Patient ID: [name], Alejandro 59 y.o.  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  Bowel activity progressed.  Taking full liquids and passing flatus and BM and ready for discharge  Consults: none  Significant Diagnostic Studies: path showed perforated diverticulitis and NO cancer    Discharge Exam: Blood pressure 122/72, pulse 75, temperature 98.6 F (37 C), temperature source Oral, resp. rate 16, height 5' 8.75" (1.746 m), weight 76.318 kg (168 lb 4 oz), SpO2 97 %. Incisions clean  Disposition: 06-Home-Health Care Svc  Discharge Instructions    Diet - low sodium heart healthy    Complete by:  As directed      Discharge instructions    Complete by:  As directed   Shower ad lib Staples may be removed 10 days from date of surgery.  Call the office for a nurse only visit.     Discharge wound care:    Complete by:  As directed   You can keep the staples covered to prevent snagging on clothes or leave wounds open.     Increase activity slowly    Complete by:  As directed             Medication List    TAKE these medications        HYDROcodone-acetaminophen 5-325 MG tablet  Commonly known as:  NORCO/VICODIN  Take 1 tablet by mouth every 6 (six) hours as needed for moderate pain or severe pain.     HYDROcodone-acetaminophen 5-325 MG tablet  Commonly known as:  NORCO/VICODIN  Take 1-2 tablets by mouth every 4 (four) hours as needed for moderate pain.     ibuprofen 200 MG tablet  Commonly known as:  ADVIL,MOTRIN  Take 600 mg by mouth every 6 (six) hours as needed (for pain).     ranitidine 150 MG tablet  Commonly known as:   ZANTAC  Take 150 mg by mouth 2 (two) times daily as needed for heartburn.     tamsulosin 0.4 MG Caps capsule  Commonly known as:  FLOMAX  Take 1 capsule (0.4 mg total) by mouth daily after breakfast.           Follow-up Information    Follow up with Luretha MurphyMARTIN,Gladiola Madore B, MD. Schedule an appointment as soon as possible for a visit in 3 weeks.   Specialty:  General Surgery   Contact information:   [address] [place] [id number] [phone]       Signed: Valarie MerinoMARTIN,[name], 11:12 AM

## 2015-03-05 NOTE — Discharge Instructions (Signed)
Laparoscopic Colectomy  Laparoscopic colectomy is surgery to remove part or all of the large intestine (colon). This procedure is used to treat several conditions, including:  · Inflammation and infection of the colon (diverticulitis).  · Tumors or masses in the colon.  · Inflammatory bowel disease, such as Crohn disease or ulcerative colitis. Colectomy is an option when symptoms cannot be controlled with medicines.  · Bleeding from the colon that cannot be controlled by another method.  · Blockage or obstruction of the colon.  LET YOUR HEALTH CARE PROVIDER KNOW ABOUT:  · Any allergies you have.  · All medicines you are taking, including vitamins, herbs, eye drops, creams, and over-the-counter medicines.  · Previous problems you or members of your family have had with the use of anesthetics.  · Any blood disorders you have.  · Previous surgeries you have had.  · Medical conditions you have.  RISKS AND COMPLICATIONS  Generally, this is a safe procedure. However, as with any procedure, complications can occur. Possible complications include:  · Infection.  · Bleeding.  · Damage to other organs.  · Leaking from where the colon was sewn together.  · Future blockage of the small intestines from scar tissue. Another surgery may be needed to repair this.  In some cases, complications such as damage to other organs or excessive bleeding may require the surgeon to convert from a laparoscopic procedure to an open procedure. This involves making a larger incision in the abdomen to perform the procedure.  BEFORE THE PROCEDURE  · Ask your health care provider about changing or stopping any regular medicines.  · You may be prescribed an oral bowel prep. This involves drinking a large amount of medicated liquid, starting the day before your surgery. The liquid will cause you to have multiple loose stools until your stool is almost clear or light green. This cleans out your colon in preparation for the surgery.  · Do not eat or  drink anything else once you have started the bowel prep, unless your health care provider tells you it is safe to do so.  · You may also be given antibiotic pills to clean out your colon of bacteria. Be sure to follow the directions carefully and take the medicine at the correct time.  PROCEDURE   · Small monitors will be put on your body. They are used to check your heart, blood pressure, and oxygen level.  · An IV access tube will be put into one of your veins. Medicine will be able to flow directly into your body through this IV tube.  · You might be given a medicine to help you relax (sedative).  · You will be given a medicine to make you sleep through the procedure (general anesthetic). A breathing tube may be placed into your lungs during the procedure.  · A thin, flexible tube (catheter) will be placed into your bladder to collect urine.  · A tube may be put in through your nose. It is called a nasogastric tube. It is used to remove stomach fluids after surgery until the intestines start working again.  · Your abdomen will be filled with air so that it expands. This gives the surgeon more room to operate and makes your organs easier to see.  · Several small cuts (incisions) are made in your abdomen.  · A thin, lighted tube with a tiny camera on the end (laparoscope) is put through one of the small incisions. The camera on the laparoscope   sends a picture to a TV screen in the operating room. This gives the surgeon a good view inside your abdomen.  · Hollow tubes are put through the other small incisions in your abdomen. The tools needed for the procedure are put through these tubes.  · Clamps or staples are put on both ends of the diseased part of the colon.  · The part of the intestine between the clamps or staples is removed.  · If possible, the ends of the healthy colon that remain will be stitched or stapled together to allow your body to expel waste (stool).  · Sometimes, the remaining colon cannot be  stitched back together. If this is the case, a colostomy is needed. For a colostomy:  ¨ An opening (stoma) to the outside of your body is made through the abdomen.  ¨ The end of the colon is brought to the opening. It is stitched to the skin.  ¨ A bag is attached to the opening. Stool will drain into this bag. The bag is removable.  ¨ The colostomy can be temporary or permanent.  · The incisions from the colectomy are closed with stitches or staples.  AFTER THE PROCEDURE  · You will be monitored closely in a recovery area until you are stable and doing well. You will then be moved to a regular hospital room.  · You will need to receive fluids through an IV tube until your bowel function has returned. This may take 1-3 days. Once your bowels are working again, you will be started on clear liquids and then advanced to solid food as tolerated.  · You will be given pain medicines to control your pain.     This information is not intended to replace advice given to you by your health care provider. Make sure you discuss any questions you have with your health care provider.     Document Released: 05/20/2002 Document Revised: 12/18/2012 Document Reviewed: 10/09/2012  Elsevier Interactive Patient Education ©2016 [employer]

## 2015-03-05 NOTE — Progress Notes (Signed)
Discharge instructions reviewed with patient, verbalized understanding.  Rx for Norco given to patient.  Patient transported to front of hospital via wheelchair with belongings in tow to be taken home by daughter.

## 2016-09-15 ENCOUNTER — Ambulatory Visit (INDEPENDENT_AMBULATORY_CARE_PROVIDER_SITE_OTHER): Payer: BLUE CROSS/BLUE SHIELD

## 2016-09-15 ENCOUNTER — Ambulatory Visit (INDEPENDENT_AMBULATORY_CARE_PROVIDER_SITE_OTHER): Payer: BLUE CROSS/BLUE SHIELD | Admitting: Podiatry

## 2016-09-15 ENCOUNTER — Encounter: Payer: Self-pay | Admitting: Podiatry

## 2016-09-15 VITALS — BP 115/75 | HR 76 | Resp 16 | Ht 69.0 in | Wt 185.0 lb

## 2016-09-15 DIAGNOSIS — S99921A Unspecified injury of right foot, initial encounter: Secondary | ICD-10-CM

## 2016-09-15 DIAGNOSIS — S99922A Unspecified injury of left foot, initial encounter: Secondary | ICD-10-CM | POA: Diagnosis not present

## 2016-09-15 DIAGNOSIS — M779 Enthesopathy, unspecified: Secondary | ICD-10-CM

## 2016-09-15 DIAGNOSIS — M205X2 Other deformities of toe(s) (acquired), left foot: Secondary | ICD-10-CM

## 2016-09-15 MED ORDER — TRIAMCINOLONE ACETONIDE 10 MG/ML IJ SUSP
10.0000 mg | Freq: Once | INTRAMUSCULAR | Status: AC
  Administered 2016-09-15: 10 mg

## 2016-09-15 NOTE — Progress Notes (Signed)
   Subjective:    Patient ID: Athena MasseMichael Caradine, male    DOB: 25-Oct-1955, 61 y.o.   MRN: [id number]  HPI Chief Complaint  Patient presents with  . Toe Injury    Bilateral; great toes-base of toe; pt stated, "Thinks stubbed toe or stepped on something 4 months ago; left foot hurts more; locks every once in a while"      Review of Systems  Musculoskeletal: Positive for gait problem.  All other systems reviewed and are negative.      Objective:   Physical Exam        Assessment & Plan:

## 2016-09-18 NOTE — Progress Notes (Signed)
Subjective:    Patient ID: [name], male   DOB: 61 y.o.   MRN: [id number]   HPI patient presents stating he injured the base of his big toe bilateral and the left one has remained tender. He states that he does have reduced range of motion first MPJ but it's not get significantly sore    Review of Systems  All other systems reviewed and are negative.       Objective:  Physical Exam  Cardiovascular: Intact distal pulses.   Musculoskeletal: Normal range of motion.  Neurological: He is alert.  Skin: Skin is warm.  Nursing note and vitals reviewed.  neurovascular status intact muscle strength adequate range of motion within normal limits with patient noted to have discomfort in the first MPJ left mostly on the lateral side and across the dorsal surface with the right showing diminished range of motion with approximate 15 dorsiflexion 15 plantar flexion with no pain or crepitus noted. Patient does have good digital perfusion and is well oriented 3     Assessment:   Hallux limitus condition left over right with inflammation fluid buildup noted and functional condition left with more structural right      Plan:   H&P and conditions reviewed with patient. At this point I did discuss long-term orthotics and watching her his feet and if it were to get worse that ultimately this may require surgery. I did go ahead today and I carefully injected the left first MPJ 3 mg Kenalog 5 mill grams Xylocaine and instructed that if this does not get better were given need to consider other treatment options   X-ray report indicate spurring of the first MPJ right with narrowing of the joint surface and mild elevation of the first metatarsal segment bilateral

## 2016-11-24 ENCOUNTER — Ambulatory Visit: Payer: BLUE CROSS/BLUE SHIELD | Admitting: Podiatry

## 2017-08-25 IMAGING — RF DG SINUS / FISTULA TRACT / ABSCESSOGRAM
2 series · 2 of 2 positions shown · IV contrast (omnipaque)
Comparison: Prior drain injection 01/05/2015

CLINICAL DATA: 59-year-old male with a history of diverticulitis
complicated by perforation, abscess and colonic fistula.
Percutaneous drainage catheter remains in place, evaluate for
persistent fistulization with the sigmoid colon.

EXAM:
ABSCESS INJECTION
CONTRAST:  5 mL Omnipaque
FLUOROSCOPY TIME:  Radiation Exposure Index (as provided by the
fluoroscopic device): 7.5 dGy
If the device does not provide the exposure index:
Fluoroscopy Time (in minutes and seconds):  12 seconds
Number of Acquired Images:  2

[Series 1: run · 1 of 1 slices shown (1 of 2)]
[im 1/1]
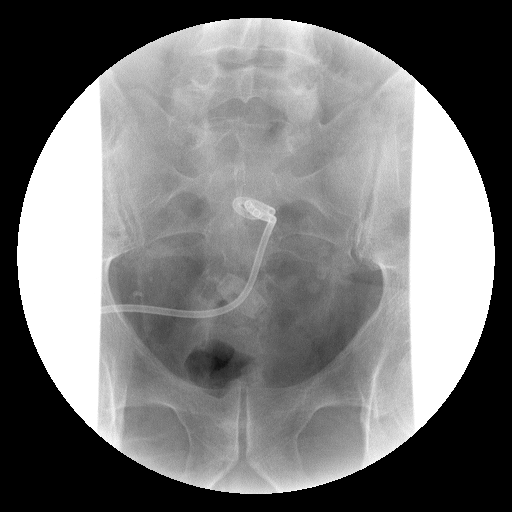

[Series 2: run · 1 of 1 slices shown (2 of 2)]
[im 1/1]
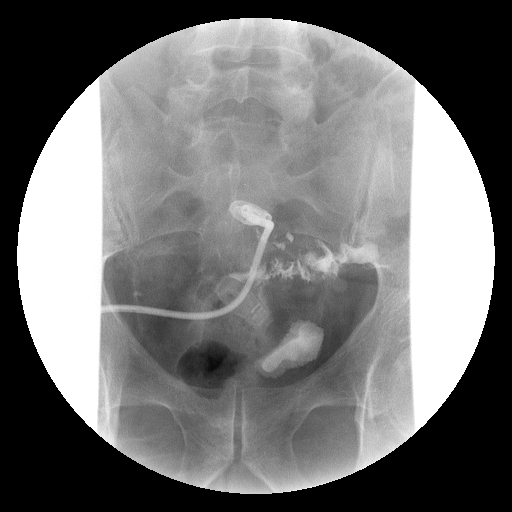

[2 of 2 positions shown; findings below may reference images not displayed]

FINDINGS: Gentle hand injection of contrast material again demonstrates a
persistent fistula with the adjacent sigmoid colon. Contrast
material then travels within the sigmoid colon and rectum. There may
be slight interval improvement in the size of the fistula compared
to 01/05/2015, however the fistula still appears fairly rapidly
during contrast injection.
IMPRESSION: Persistent and perhaps incrementally improved fistula with the
sigmoid colon.
